# Patient Record
Sex: Male | Born: 1950 | Race: White | Hispanic: No | Marital: Married | Smoking: Former smoker
Health system: Southern US, Community
[De-identification: ages and names within clinical notes are randomized; demographics above are authoritative.]

## PROBLEM LIST (undated history)

## (undated) DIAGNOSIS — M1990 Unspecified osteoarthritis, unspecified site: Secondary | ICD-10-CM

## (undated) HISTORY — PX: WISDOM TOOTH EXTRACTION: SHX21

## (undated) HISTORY — PX: NEUROMA SURGERY: SHX722

## (undated) HISTORY — PX: CATARACT EXTRACTION: SUR2

---

## 2014-01-26 DIAGNOSIS — N400 Enlarged prostate without lower urinary tract symptoms: Secondary | ICD-10-CM | POA: Diagnosis present

## 2015-05-30 DIAGNOSIS — N529 Male erectile dysfunction, unspecified: Secondary | ICD-10-CM | POA: Insufficient documentation

## 2018-07-29 DIAGNOSIS — M5416 Radiculopathy, lumbar region: Secondary | ICD-10-CM | POA: Insufficient documentation

## 2019-09-14 ENCOUNTER — Ambulatory Visit (INDEPENDENT_AMBULATORY_CARE_PROVIDER_SITE_OTHER): Payer: Medicare HMO

## 2019-09-14 ENCOUNTER — Telehealth: Payer: Self-pay

## 2019-09-14 ENCOUNTER — Other Ambulatory Visit: Payer: Self-pay

## 2019-09-14 ENCOUNTER — Ambulatory Visit: Payer: Medicare HMO | Admitting: Orthopaedic Surgery

## 2019-09-14 DIAGNOSIS — G8929 Other chronic pain: Secondary | ICD-10-CM

## 2019-09-14 DIAGNOSIS — M1712 Unilateral primary osteoarthritis, left knee: Secondary | ICD-10-CM | POA: Diagnosis not present

## 2019-09-14 DIAGNOSIS — M25562 Pain in left knee: Secondary | ICD-10-CM

## 2019-09-14 NOTE — Telephone Encounter (Signed)
Left knee gel injection ?

## 2019-09-14 NOTE — Telephone Encounter (Signed)
Submitted for VOB for Monovisc-left knee  

## 2019-09-14 NOTE — Progress Notes (Signed)
Office Visit Note   Patient: Brandon Blackwell           Date of Birth: [birth year]           MRN: [id number] Visit Date: 09/14/2019              Requested by: No referring provider defined for this encounter. PCP: Patient, No Pcp Per   Assessment & Plan: Visit Diagnoses:  1. Chronic pain of left knee   2. Unilateral primary osteoarthritis, left knee     Plan: The patient understands that he does have significant osteoarthritis of his left knee.  Given the fact that he has failed a steroid injection in his left knee I do feel he is a candidate to try hyaluronic acid.  I gave him a handout about this.  We talked about the risk and benefits of those types of injections and a treatment plan for if that fails which would be a knee replacement.  All questions and concerns were answered and addressed.  We will see him back in about 3 weeks to place hopefully hyaluronic acid into the left knee to treat the pain from osteoarthritis.  Follow-Up Instructions: Return in about 3 weeks (around 10/05/2019).   Orders:  Orders Placed This Encounter  Procedures  . XR Knee 1-2 Views Left   No orders of the defined types were placed in this encounter.     Procedures: No procedures performed   Clinical Data: No additional findings.   Subjective: Chief Complaint  Patient presents with  . Left Knee - Pain  The patient is someone I am seeing for the first time as a new patient.  He comes in as referral from other patients of mine.  He comes in with a chief complaint of left knee pain is been getting slowly worse for a long period of time.  He has had a steroid injection in his left knee that did not help much at all.  He points to mainly the medial joint line as a source of his pain.  It hurts mainly with activities.  He is a very active 69 year old gentleman.  He denies any locking and catching.  It is more of an aching type of pain.  He is otherwise a very active individual with no significant  medical issues.  HPI  Review of Systems He currently denies any headache, chest pain, shortness of breath, fever, chills, nausea, vomiting  Objective: Vital Signs: There were no vitals taken for this visit.  Physical Exam He is alert and orient x3 and in no acute distress Ortho Exam Examination of his left knee does show varus malalignment that is difficult to correct.  He has full range of motion of his knee with significant patellofemoral crepitation as well as medial joint line tenderness.  His knee feels ligamentously stable. Specialty Comments:  No specialty comments available.  Imaging: XR Knee 1-2 Views Left  Result Date: 09/14/2019 2 views of the left knee show tricompartmental arthritic changes.  There is varus malalignment.  There is osteophytes in all 3 compartments.  There is significant medial joint space narrowing.  There is also sclerotic changes.    PMFS History: Patient Active Problem List   Diagnosis Date Noted  . Unilateral primary osteoarthritis, left knee 09/14/2019   No past medical history on file.  No family history on file.   Social History   Occupational History  . Not on file  Tobacco Use  . Smoking status: Not on  file  Substance and Sexual Activity  . Alcohol use: Not on file  . Drug use: Not on file  . Sexual activity: Not on file

## 2019-09-15 ENCOUNTER — Telehealth: Payer: Self-pay

## 2019-09-15 NOTE — Telephone Encounter (Signed)
Approved for Monovisc-left knee  Dr. Waynette Buttery 10/05/19 New start Buy and Bill $25 copay 20% OOP Prior auth required with [employer] auth # [id number] Dates : 09/15/19-03/14/20

## 2019-09-27 ENCOUNTER — Ambulatory Visit: Payer: Medicare HMO | Attending: Internal Medicine

## 2019-09-27 DIAGNOSIS — Z23 Encounter for immunization: Secondary | ICD-10-CM | POA: Insufficient documentation

## 2019-09-27 NOTE — Progress Notes (Signed)
   Covid-19 Vaccination Clinic  Name:  Brandon Blackwell    MRN: [id number] DOB: 02-18-1951  09/27/2019  [name] was observed post Covid-19 immunization for 15 minutes without incidence. He was provided with Vaccine Information Sheet and instruction to access the V-Safe system.   [name] was instructed to call 911 with any severe reactions post vaccine: Marland Kitchen Difficulty breathing  . Swelling of your face and throat  . A fast heartbeat  . A bad rash all over your body  . Dizziness and weakness    Immunizations Administered    Name Date Dose VIS Date Route   Pfizer COVID-19 Vaccine 09/27/2019  8:52 AM 0.3 mL 07/10/2019 Intramuscular   Manufacturer: [employer], [employer]   Lot: [id number]   NDC: [address]-7

## 2019-10-05 ENCOUNTER — Ambulatory Visit: Payer: Medicare HMO | Admitting: Orthopaedic Surgery

## 2019-10-05 ENCOUNTER — Encounter: Payer: Self-pay | Admitting: Orthopaedic Surgery

## 2019-10-05 ENCOUNTER — Other Ambulatory Visit: Payer: Self-pay

## 2019-10-05 DIAGNOSIS — M1712 Unilateral primary osteoarthritis, left knee: Secondary | ICD-10-CM

## 2019-10-05 MED ORDER — HYALURONAN 88 MG/4ML IX SOSY
88.0000 mg | PREFILLED_SYRINGE | INTRA_ARTICULAR | Status: AC | PRN
  Administered 2019-10-05: 88 mg via INTRA_ARTICULAR

## 2019-10-05 NOTE — Progress Notes (Signed)
   Procedure Note  Patient: Brandon Blackwell             Date of Birth: [birth year]           MRN: [id number]             Visit Date: 10/05/2019  Procedures: Visit Diagnoses:  1. Unilateral primary osteoarthritis, left knee     Large Joint Inj: L knee on 10/05/2019 9:26 AM Indications: diagnostic evaluation and pain Details: 22 G 1.5 in needle, superolateral approach  Arthrogram: No  Medications: 88 mg Hyaluronan 88 MG/4ML Outcome: tolerated well, no immediate complications Procedure, treatment alternatives, risks and benefits explained, specific risks discussed. Consent was given by the patient. Immediately prior to procedure a time out was called to verify the correct patient, procedure, equipment, support staff and site/side marked as required. Patient was prepped and draped in the usual sterile fashion.    The patient comes in today for scheduled hyaluronic acid injection with Monovisc in the left knee to treat the pain from osteoarthritis.  He has tried and failed other forms of conservative treatment including steroid injection.  His pain is daily.  It hurts mainly with mobility.  There is some stiffness in his knee as well.  On exam there is no effusion today and his left knee moves smoothly.  It is ligamentously stable but has some global tenderness consistent with osteoarthritis.  At this point I did place Monovisc in the left knee without difficulty.  All question concerns were answered addressed.  Follow-up will be as needed.  If conservative treatment continues to fail our other recommendation would be a knee replacement.

## 2019-10-27 ENCOUNTER — Ambulatory Visit: Payer: Medicare HMO | Attending: Internal Medicine

## 2019-10-27 DIAGNOSIS — Z23 Encounter for immunization: Secondary | ICD-10-CM

## 2019-10-27 NOTE — Progress Notes (Signed)
   Covid-19 Vaccination Clinic  Name:  Brandon Blackwell    MRN: [id number] DOB: 08/02/1950  10/27/2019  Brandon Blackwell was observed post Covid-19 immunization for 15 minutes without incident. He was provided with Vaccine Information Sheet and instruction to access the V-Safe system.   Brandon Blackwell was instructed to call 911 with any severe reactions post vaccine: Marland Kitchen Difficulty breathing  . Swelling of face and throat  . A fast heartbeat  . A bad rash all over body  . Dizziness and weakness   Immunizations Administered    Name Date Dose VIS Date Route   Pfizer COVID-19 Vaccine 10/27/2019  8:19 AM 0.3 mL 07/10/2019 Intramuscular   Manufacturer: [employer], [employer]   Lot: [id number]   NDC: [address]-9

## 2020-04-26 ENCOUNTER — Telehealth: Payer: Self-pay

## 2020-04-26 NOTE — Telephone Encounter (Signed)
Ye.  That will be fine to go ahead and order. Thanks

## 2020-04-26 NOTE — Telephone Encounter (Signed)
Is it okay for patient to receive another gel injection for left knee?  Please advise.  Thank you.

## 2020-04-26 NOTE — Telephone Encounter (Signed)
Ok to get [place] and schedule? Last gel injection was March 2021

## 2020-04-27 NOTE — Telephone Encounter (Signed)
Noted  

## 2020-04-28 ENCOUNTER — Telehealth: Payer: Self-pay

## 2020-04-28 NOTE — Telephone Encounter (Signed)
Submitted VOB, Monovisc, left knee. 

## 2020-04-29 ENCOUNTER — Telehealth: Payer: Self-pay

## 2020-04-29 NOTE — Telephone Encounter (Signed)
PA required for Monovisc, left knee. PA currently Pending through [employer] online Pending PA# [phone]

## 2020-05-05 ENCOUNTER — Telehealth: Payer: Self-pay

## 2020-05-05 NOTE — Telephone Encounter (Signed)
Called and left a VM for pateint to call back and schedule an appointment with Dr. Magnus Ivan or Bronson Curb for gel injection.  Approved, Monovisc, left knee. Buy & Bill Patient will be responsible for 20% OOP. Co-pay of $25.00 PA required PA Approval# [id number] Valid 04/29/2020- 10/28/2020

## 2020-05-11 ENCOUNTER — Ambulatory Visit: Payer: Medicare HMO | Admitting: Physician Assistant

## 2020-05-11 ENCOUNTER — Encounter: Payer: Self-pay | Admitting: Physician Assistant

## 2020-05-11 DIAGNOSIS — M1712 Unilateral primary osteoarthritis, left knee: Secondary | ICD-10-CM

## 2020-05-11 MED ORDER — LIDOCAINE HCL 1 % IJ SOLN
0.5000 mL | INTRAMUSCULAR | Status: AC | PRN
  Administered 2020-05-11: .5 mL

## 2020-05-11 MED ORDER — HYALURONAN 88 MG/4ML IX SOSY
88.0000 mg | PREFILLED_SYRINGE | INTRA_ARTICULAR | Status: AC | PRN
  Administered 2020-05-11: 88 mg via INTRA_ARTICULAR

## 2020-05-11 NOTE — Progress Notes (Signed)
   Procedure Note  Patient: Brandon Blackwell             Date of Birth: 03-Apr-1951           MRN: [id number]             Visit Date: 05/11/2020 HPI: Patient returns today for Monovisc injection left knee.  States he has some deep joint pain worse with walking.  Sometimes has sharp shooting pain like the knee may give way.  Takes Advil as needed.    Physical exam: Left knee good range of motion.  Positive effusion no abnormal warmth erythema.  Procedures: Visit Diagnoses:  1. Unilateral primary osteoarthritis, left knee     Large Joint Inj on 05/11/2020 5:31 PM Indications: pain Details: 22 G 1.5 in needle, superolateral approach  Arthrogram: No  Medications: 88 mg Hyaluronan 88 MG/4ML; 0.5 mL lidocaine 1 % Aspirate: 22 mL yellow Outcome: tolerated well, no immediate complications Procedure, treatment alternatives, risks and benefits explained, specific risks discussed. Consent was given by the patient. Immediately prior to procedure a time out was called to verify the correct patient, procedure, equipment, support staff and site/side marked as required. Patient was prepped and draped in the usual sterile fashion.    Plan: He will follow-up on as-needed basis.  He knows to wait at least 6 months between injections with Monovisc 3 months for cortisone.  Questions were encouraged and answered.

## 2020-05-11 NOTE — Addendum Note (Signed)
Addended by: Richardean Canal on: 05/11/2020 05:33 PM   Modules accepted: Orders

## 2020-06-19 ENCOUNTER — Other Ambulatory Visit: Payer: Self-pay

## 2020-06-19 ENCOUNTER — Encounter (HOSPITAL_BASED_OUTPATIENT_CLINIC_OR_DEPARTMENT_OTHER): Payer: Self-pay | Admitting: Emergency Medicine

## 2020-06-19 ENCOUNTER — Emergency Department (HOSPITAL_BASED_OUTPATIENT_CLINIC_OR_DEPARTMENT_OTHER)
Admission: EM | Admit: 2020-06-19 | Discharge: 2020-06-19 | Disposition: A | Discharge status: Home / Self Care | Payer: Medicare HMO | Attending: Emergency Medicine | Admitting: Emergency Medicine

## 2020-06-19 ENCOUNTER — Emergency Department (HOSPITAL_BASED_OUTPATIENT_CLINIC_OR_DEPARTMENT_OTHER): Payer: Medicare HMO

## 2020-06-19 DIAGNOSIS — R197 Diarrhea, unspecified: Secondary | ICD-10-CM | POA: Diagnosis not present

## 2020-06-19 DIAGNOSIS — J069 Acute upper respiratory infection, unspecified: Secondary | ICD-10-CM | POA: Diagnosis not present

## 2020-06-19 DIAGNOSIS — R1110 Vomiting, unspecified: Secondary | ICD-10-CM | POA: Insufficient documentation

## 2020-06-19 DIAGNOSIS — R059 Cough, unspecified: Secondary | ICD-10-CM | POA: Diagnosis present

## 2020-06-19 DIAGNOSIS — Z20822 Contact with and (suspected) exposure to covid-19: Secondary | ICD-10-CM | POA: Insufficient documentation

## 2020-06-19 DIAGNOSIS — J40 Bronchitis, not specified as acute or chronic: Secondary | ICD-10-CM | POA: Diagnosis not present

## 2020-06-19 LAB — RESP PANEL BY RT-PCR (FLU A&B, COVID) ARPGX2
Influenza A by PCR: NEGATIVE
Influenza B by PCR: NEGATIVE
SARS Coronavirus 2 by RT PCR: NEGATIVE

## 2020-06-19 MED ORDER — ONDANSETRON 8 MG PO TBDP: 8.0000 mg | ORAL_TABLET | Freq: Three times a day (TID) | ORAL | 0 refills | Status: DC | PRN

## 2020-06-19 MED ORDER — HYDROCOD POLST-CPM POLST ER 10-8 MG/5ML PO SUER: 5.0000 mL | Freq: Two times a day (BID) | ORAL | 0 refills | Status: DC | PRN

## 2020-06-19 MED ORDER — ALBUTEROL SULFATE HFA 108 (90 BASE) MCG/ACT IN AERS
2.0000 | INHALATION_SPRAY | RESPIRATORY_TRACT | Status: DC | PRN
  Administered 2020-06-19: 2 via RESPIRATORY_TRACT
  Filled 2020-06-19: qty 6.7

## 2020-06-19 MED ORDER — ONDANSETRON 4 MG PO TBDP
8.0000 mg | ORAL_TABLET | Freq: Once | ORAL | Status: AC
  Administered 2020-06-19: 8 mg via ORAL
  Filled 2020-06-19: qty 2

## 2020-06-19 NOTE — ED Notes (Signed)
Patient transported to X-ray 

## 2020-06-19 NOTE — ED Triage Notes (Signed)
Cough, nausea since Tuesday, states he "had a cold since Sunday, but that got better". Pt believes his cough to be worse "in different positions", also endorses general abd pain and diarrhea. Denies fever, reports decreased intake x 4 days.  States he has been fully vaccinated for Covid (second shot 10/27/19) and took an at-home Covid test which was negative.

## 2020-06-19 NOTE — ED Provider Notes (Addendum)
[facility] Provider Note: Lowella Dell, MD, FACEP  CSN: [id number] MRN: [id number] ARRIVAL: 06/19/20 at 0311 ROOM: [facility]01/[facility]   CHIEF COMPLAINT  Cough   HISTORY OF PRESENT ILLNESS  06/19/20 3:31 AM Brandon Blackwell is a 69 y.o. male with URI symptoms for about a week.  Specifically he has had a cough which is occasionally productive.  It has become severe enough to keep him awake at night.  He has not gotten adequate relief with over-the-counter medications.  He has had diarrhea and a "queasy" feeling in his stomach including one episode of vomiting.  He has also had acid reflux for the past 2 days for which she started Nexium.  He has not had a fever, body aches, sore throat, shortness of breath, or loss of taste or smell.  He took a home Covid test 5 days ago which was negative.  He has been vaccinated for both Covid and influenza.    History reviewed. No pertinent past medical history.  Past Surgical History:  Procedure Laterality Date  . CATARACT EXTRACTION      No family history on file.  Social History   Tobacco Use  . Smoking status: Never Smoker  . Smokeless tobacco: Never Used  Substance Use Topics  . Alcohol use: Never  . Drug use: Never    Prior to Admission medications   Medication Sig Start Date End Date Taking? Authorizing Provider  chlorpheniramine-HYDROcodone (TUSSIONEX PENNKINETIC ER) 10-8 MG/5ML SUER Take 5 mLs by mouth every 12 (twelve) hours as needed for cough. 06/19/20   Marquis Down, MD  ondansetron (ZOFRAN ODT) 8 MG disintegrating tablet Take 1 tablet (8 mg total) by mouth every 8 (eight) hours as needed for nausea or vomiting. 06/19/20   Jazir Newey, MD  sildenafil (REVATIO) 20 MG tablet TAKE 2-5 TABLETS 1 HOUR PRIOR AS NEEDED 01/15/19   [provider]  tadalafil (CIALIS) 5 MG tablet TAKE ONE TABLET BY MOUTH DAILY 08/24/19   [provider]    Allergies Patient has no known allergies.   REVIEW OF SYSTEMS    Negative except as noted here or in the History of Present Illness.   PHYSICAL EXAMINATION  Initial Vital Signs Blood pressure (!) 160/101, pulse 87, temperature 98.2 F (36.8 C), resp. rate 20, height 6\' 1"  (1.854 m), weight 102.1 kg, SpO2 95 %.  Examination General: Well-developed, well-nourished male in no acute distress; appearance consistent with age of record HENT: normocephalic; atraumatic Eyes: pupils equal, round and reactive to light; extraocular muscles intact Neck: supple Heart: regular rate and rhythm Lungs: clear to auscultation bilaterally Abdomen: soft; nondistended; nontender; bowel sounds present Extremities: No deformity; full range of motion; pulses normal Neurologic: Awake, alert and oriented; motor function intact in all extremities and symmetric; no facial droop Skin: Warm and dry Psychiatric: Normal mood and affect   RESULTS  Summary of this visit's results, reviewed and interpreted by myself:   EKG Interpretation  Date/Time:    Ventricular Rate:    PR Interval:    QRS Duration:   QT Interval:    QTC Calculation:   R Axis:     Text Interpretation:        Laboratory Studies: Results for orders placed or performed during the hospital encounter of 06/19/20 (from the past 24 hour(s))  Resp Panel by RT-PCR (Flu A&B, Covid) Nasopharyngeal Swab     Status: None   Collection Time: 06/19/20  3:32 AM   Specimen: Nasopharyngeal Swab; Nasopharyngeal(NP) swabs in vial  transport medium  Result Value Ref Range   SARS Coronavirus 2 by RT PCR NEGATIVE NEGATIVE   Influenza A by PCR NEGATIVE NEGATIVE   Influenza B by PCR NEGATIVE NEGATIVE   Imaging Studies: DG Chest 2 View  Result Date: 06/19/2020 CLINICAL DATA:  Initial evaluation for acute cough. EXAM: CHEST - 2 VIEW COMPARISON:  None. FINDINGS: Transverse heart size at the upper limits of normal. Mediastinal silhouette within normal limits. Lungs hypoinflated. Linear bibasilar opacities most consistent  with subsegmental atelectasis. No focal infiltrates. No edema or effusion. No pneumothorax. No acute osseous finding. Remotely healed left-sided rib fractures noted. IMPRESSION: 1. Shallow lung inflation with mild bibasilar subsegmental atelectasis. 2. No other active cardiopulmonary disease. Electronically Signed   By: Rise Mu M.D.   On: 06/19/2020 04:43    ED COURSE and MDM  Nursing notes, initial and subsequent vitals signs, including pulse oximetry, reviewed and interpreted by myself.  Vitals:   06/19/20 0323 06/19/20 0450  BP: (!) 160/101 (!) 145/83  Pulse: 87 81  Resp: 20 18  Temp: 98.2 F (36.8 C)   SpO2: 95% 96%  Weight: 102.1 kg   Height: 6\' 1"  (1.854 m)    Medications  albuterol (VENTOLIN HFA) 108 (90 Base) MCG/ACT inhaler 2 puff (2 puffs Inhalation Given 06/19/20 0453)  ondansetron (ZOFRAN-ODT) disintegrating tablet 8 mg (8 mg Oral Given 06/19/20 0342)   4:44 AM The patient is insisting on being discharged despite the Covid test not having resulted.  His presentation is consistent with a viral respiratory illness.  He was advised he could be COVID-19, influenza or something else.  He may benefit from an albuterol inhaler given his shallow lung inflation.   PROCEDURES  Procedures   ED DIAGNOSES     ICD-10-CM   1. Viral URI with cough  J06.9        Zanayah Shadowens, MD 06/19/20 0447    06/21/20, MD 06/19/20 [phone]

## 2020-06-21 DIAGNOSIS — N183 Chronic kidney disease, stage 3 unspecified: Secondary | ICD-10-CM | POA: Diagnosis present

## 2020-06-21 DIAGNOSIS — D751 Secondary polycythemia: Secondary | ICD-10-CM | POA: Insufficient documentation

## 2020-06-21 DIAGNOSIS — R0989 Other specified symptoms and signs involving the circulatory and respiratory systems: Secondary | ICD-10-CM | POA: Insufficient documentation

## 2020-06-21 DIAGNOSIS — K219 Gastro-esophageal reflux disease without esophagitis: Secondary | ICD-10-CM | POA: Diagnosis present

## 2021-01-23 ENCOUNTER — Telehealth: Payer: Self-pay | Admitting: Orthopaedic Surgery

## 2021-01-23 NOTE — Telephone Encounter (Signed)
Patient called needing to get the gel injection. The number to contact patient is  [phone]

## 2021-01-23 NOTE — Telephone Encounter (Signed)
Noted  

## 2021-01-23 NOTE — Telephone Encounter (Signed)
Last done in October. Ok for repeat. Please get authorization. Thank you

## 2021-02-10 ENCOUNTER — Telehealth: Payer: Self-pay

## 2021-02-10 NOTE — Telephone Encounter (Signed)
VOB submitted for Monovisc, left knee. Pending BV. 

## 2021-03-13 ENCOUNTER — Ambulatory Visit: Payer: Medicare HMO | Admitting: Orthopaedic Surgery

## 2021-03-13 ENCOUNTER — Telehealth: Payer: Self-pay

## 2021-03-13 ENCOUNTER — Other Ambulatory Visit: Payer: Self-pay

## 2021-03-13 VITALS — Ht 73.0 in | Wt 225.0 lb

## 2021-03-13 DIAGNOSIS — M1712 Unilateral primary osteoarthritis, left knee: Secondary | ICD-10-CM

## 2021-03-13 DIAGNOSIS — M25562 Pain in left knee: Secondary | ICD-10-CM

## 2021-03-13 DIAGNOSIS — G8929 Other chronic pain: Secondary | ICD-10-CM

## 2021-03-13 MED ORDER — HYALURONAN 88 MG/4ML IX SOSY
88.0000 mg | PREFILLED_SYRINGE | INTRA_ARTICULAR | Status: AC | PRN
  Administered 2021-03-13: 88 mg via INTRA_ARTICULAR

## 2021-03-13 MED ORDER — LIDOCAINE HCL 1 % IJ SOLN
3.0000 mL | INTRAMUSCULAR | Status: AC | PRN
  Administered 2021-03-13: 3 mL

## 2021-03-13 NOTE — Telephone Encounter (Signed)
Approved for Monovisc-left knee Buy and bill Dr. Magnus Ivan 20% copay $20 copay [employer] auth # [id number] Dates: 02/22/21-05/23/21

## 2021-03-13 NOTE — Progress Notes (Signed)
   Procedure Note  Patient: Brandon Blackwell             Date of Birth: 09-06-1950           MRN: [id number]             Visit Date: 03/13/2021  Procedures: Visit Diagnoses:  1. Unilateral primary osteoarthritis, left knee   2. Chronic pain of left knee     Large Joint Inj: L knee on 03/13/2021 9:41 AM Indications: diagnostic evaluation and pain Details: 22 G 1.5 in needle, superolateral approach  Arthrogram: No  Medications: 3 mL lidocaine 1 %; 88 mg Hyaluronan 88 MG/4ML Outcome: tolerated well, no immediate complications Procedure, treatment alternatives, risks and benefits explained, specific risks discussed. Consent was given by the patient. Immediately prior to procedure a time out was called to verify the correct patient, procedure, equipment, support staff and site/side marked as required. Patient was prepped and draped in the usual sterile fashion.   The patient comes in today for scheduled hyaluronic acid injection with Monovisc in his left knee to treat the pain from osteoarthritis.  He has had these injections in the past and this is really helped him well in terms of dealing with the osteoarthritis pain in his left knee.  Steroid injections and other conservative treatments have not helped as much as the hyaluronic acid has.  It has been 6 months since his last injection and that is lasted until now and this is the best treatment regimen for him other than knee replacement surgery.  He has had no acute change in medical status.  I was able to aspirate 30 cc of clear yellow fluid from the knee consistent with osteoarthritis.  I then placed a Monovisc injection in his left knee which he tolerated well.  He knows to wait at least a minimum of 6 months between injections.  All questions and concerns were answered and addressed.

## 2021-11-29 ENCOUNTER — Telehealth: Payer: Self-pay | Admitting: Orthopaedic Surgery

## 2021-11-29 NOTE — Telephone Encounter (Signed)
VOB submitted for Monovisc, left knee. °BV pending. °

## 2021-11-29 NOTE — Telephone Encounter (Signed)
Approval for next gel injection. Please submit per patient and call once approved. ?

## 2021-12-14 ENCOUNTER — Other Ambulatory Visit: Payer: Self-pay

## 2021-12-14 ENCOUNTER — Telehealth: Payer: Self-pay

## 2021-12-14 DIAGNOSIS — M1712 Unilateral primary osteoarthritis, left knee: Secondary | ICD-10-CM

## 2021-12-14 NOTE — Telephone Encounter (Signed)
Called and left a VM for patient to call back  and schedule for gel injection with Dr. Magnus Ivan or Richardean Canal  Check referrals tab

## 2022-01-01 ENCOUNTER — Encounter: Payer: Self-pay | Admitting: Orthopaedic Surgery

## 2022-01-01 ENCOUNTER — Ambulatory Visit: Payer: Medicare HMO | Admitting: Orthopaedic Surgery

## 2022-01-01 DIAGNOSIS — M1712 Unilateral primary osteoarthritis, left knee: Secondary | ICD-10-CM

## 2022-01-01 MED ORDER — HYALURONAN 88 MG/4ML IX SOSY
88.0000 mg | PREFILLED_SYRINGE | INTRA_ARTICULAR | Status: AC | PRN
  Administered 2022-01-01: 88 mg via INTRA_ARTICULAR

## 2022-01-01 NOTE — Progress Notes (Signed)
   Procedure Note  Patient: Martrell Eguia             Date of Birth: January 04, 1951           MRN: [id number]             Visit Date: 01/01/2022  Procedures: Visit Diagnoses:  1. Unilateral primary osteoarthritis, left knee     Large Joint Inj: L knee on 01/01/2022 9:32 AM Indications: diagnostic evaluation and pain Details: 22 G 1.5 in needle, superolateral approach  Arthrogram: No  Medications: 88 mg Hyaluronan 88 MG/4ML Outcome: tolerated well, no immediate complications Procedure, treatment alternatives, risks and benefits explained, specific risks discussed. Consent was given by the patient. Immediately prior to procedure a time out was called to verify the correct patient, procedure, equipment, support staff and site/side marked as required. Patient was prepped and draped in the usual sterile fashion.   The patient is a 71 year old gentleman well-known to me.  He has significant arthritis in his left knee.  He has had hyaluronic acid in the past and that is helped him in terms of pain control for his knee and the last injection was over 6 months ago.  He has had no acute change in medical status.  On exam today there is no significant effusion of his left knee but there is varus malalignment and patellofemoral crepitation but excellent range of motion.  I did place Monovisc in his left knee today without difficulty.  All questions and concerns were answered and addressed.  Follow-up is as needed.  He knows to wait at least 6 months between hyaluronic acid injections.

## 2022-06-18 ENCOUNTER — Ambulatory Visit (INDEPENDENT_AMBULATORY_CARE_PROVIDER_SITE_OTHER): Payer: Medicare HMO

## 2022-06-18 ENCOUNTER — Encounter: Payer: Self-pay | Admitting: Orthopaedic Surgery

## 2022-06-18 ENCOUNTER — Ambulatory Visit: Payer: Medicare HMO | Admitting: Orthopaedic Surgery

## 2022-06-18 VITALS — Ht 71.0 in | Wt 214.6 lb

## 2022-06-18 DIAGNOSIS — M1712 Unilateral primary osteoarthritis, left knee: Secondary | ICD-10-CM | POA: Diagnosis not present

## 2022-06-18 NOTE — Progress Notes (Signed)
The patient has a long history as it relates to the osteoarthritis of his left knee.  He is 71 years old and very active.  He has been dealing with left knee pain for many years now and has tried multiple forms of conservative treatment including even steroid injections and hyaluronic acid injections for his left knee to treat the pain from osteoarthritis.  This has been well-documented with clinical exam and x-rays.  The last x-rays were from 2 and half years ago of his left knee showing varus malalignment and bone-on-bone wear the medial compartment and patellofemoral joint.  At this point the patient is interested in knee replacement surgery.  His left knee pain has become daily and it is detrimentally affecting his mobility, his quality of life and his actives daily living.  He is a thin individual.  He is not a diabetic.  He works out regularly as well.  His wife is with him today.  We have tried again steroid injections and hyaluronic acid injections.  He is worked on a regular exercise routine with strengthening his quads.  On exam he has excellent range of motion of his  knee but varus malalignment that is barely correctable.  He has significant medial joint line tenderness and severe patellofemoral crepitation the patella tracks well and there is good range of motion.  X-rays of the left knee show worsening and severe tricompartment arthritis.  There is varus malalignment with significant loss there is almost complete loss of the medial compartment and patellofemoral joint.  There are osteophytes in all 3 compartments.  At this point the patient is interested in knee replacement surgery.  I showed him a knee replacement model and explained in length in detail what the surgery involves.  We discussed the risk and benefits of surgery.  We discussed what to expect from an intraoperative and postoperative course.  All questions and concerns were answered and addressed.  We will be in touch with scheduling  surgery.

## 2022-07-02 ENCOUNTER — Other Ambulatory Visit: Payer: Self-pay

## 2022-07-04 ENCOUNTER — Other Ambulatory Visit: Payer: Self-pay | Admitting: Physician Assistant

## 2022-07-04 DIAGNOSIS — Z01818 Encounter for other preprocedural examination: Secondary | ICD-10-CM

## 2022-07-04 NOTE — Progress Notes (Signed)
Sent message, via epic in basket, requesting orders in epic from surgeon.  

## 2022-07-04 NOTE — Patient Instructions (Addendum)
SURGICAL WAITING ROOM VISITATION Patients having surgery or a procedure may have no more than 2 support people in the waiting area - these visitors may rotate.   Children under the age of 22 must have an adult with them who is not the patient. If the patient needs to stay at the hospital during part of their recovery, the visitor guidelines for inpatient rooms apply. Pre-op nurse will coordinate an appropriate time for 1 support person to accompany patient in pre-op.  This support person may not rotate.    Please refer to the [facility] website for the visitor guidelines for Inpatients (after your surgery is over and you are in a regular room).      Your procedure is scheduled on: 07-13-22   Report to [facility] Main Entrance    Report to admitting at 11:00 AM   Call this number if you have problems the morning of surgery [phone]   Do not eat food :After Midnight.   After Midnight you may have the following liquids until 10:30 AM DAY OF SURGERY  Water Non-Citrus Juices (without pulp, NO RED) Carbonated Beverages Black Coffee (NO MILK/CREAM OR CREAMERS, sugar ok)  Clear Tea (NO MILK/CREAM OR CREAMERS, sugar ok) regular and decaf                             Plain Jell-O (NO RED)                                           Fruit ices (not with fruit pulp, NO RED)                                     Popsicles (NO RED)                                                               Sports drinks like Gatorade (NO RED)                   The day of surgery:  Drink ONE (1) Pre-Surgery Clear Ensure at 10:30 AM the morning of surgery. Drink in one sitting. Do not sip.  This drink was given to you during your hospital  pre-op appointment visit. Nothing else to drink after completing the Pre-Surgery Clear Ensure.          If you have questions, please contact your surgeon's office.   FOLLOW  ANY ADDITIONAL PRE OP INSTRUCTIONS YOU RECEIVED FROM YOUR SURGEON'S OFFICE!!!      Oral Hygiene is also important to reduce your risk of infection.                                    Remember - BRUSH YOUR TEETH THE MORNING OF SURGERY WITH YOUR REGULAR TOOTHPASTE   Do NOT smoke after Midnight   Take these medicines the morning of surgery with A SIP OF WATER:   Tylenol if needed  You may not have any metal on your body including jewelry, and body piercing             Do not wear lotions, powders, cologne, or deodorant              Men may shave face and neck.   Do not bring valuables to the hospital. [facility] IS NOT RESPONSIBLE   FOR VALUABLES.   Contacts, dentures or bridgework may not be worn into surgery.   Bring small overnight bag day of surgery.   DO NOT BRING YOUR HOME MEDICATIONS TO THE HOSPITAL. PHARMACY WILL DISPENSE MEDICATIONS LISTED ON YOUR MEDICATION LIST TO YOU DURING YOUR ADMISSION IN THE HOSPITAL!    Special Instructions: Bring a copy of your healthcare power of attorney and living will documents the day of surgery if you haven't scanned them before.              Please read over the following fact sheets you were given: IF YOU HAVE QUESTIONS ABOUT YOUR PRE-OP INSTRUCTIONS PLEASE CALL [phone] Gwen  If you received a COVID test during your pre-op visit  it is requested that you wear a mask when out in public, stay away from anyone that may not be feeling well and notify your surgeon if you develop symptoms. If you test positive for Covid or have been in contact with anyone that has tested positive in the last 10 days please notify you surgeon.  [place] - Preparing for Surgery Before surgery, you can play an important role.  Because skin is not sterile, your skin needs to be as free of germs as possible.  You can reduce the number of germs on your skin by washing with CHG (chlorahexidine gluconate) soap before surgery.  CHG is an antiseptic cleaner which kills germs and bonds with the skin to continue killing  germs even after washing. Please DO NOT use if you have an allergy to CHG or antibacterial soaps.  If your skin becomes reddened/irritated stop using the CHG and inform your nurse when you arrive at Short Stay. Do not shave (including legs and underarms) for at least 48 hours prior to the first CHG shower.  You may shave your face/neck.  Please follow these instructions carefully:  1.  Shower with CHG Soap the night before surgery and the  morning of surgery.  2.  If you choose to wash your hair, wash your hair first as usual with your normal  shampoo.  3.  After you shampoo, rinse your hair and body thoroughly to remove the shampoo.                             4.  Use CHG as you would any other liquid soap.  You can apply chg directly to the skin and wash.  Gently with a scrungie or clean washcloth.  5.  Apply the CHG Soap to your body ONLY FROM THE NECK DOWN.   Do   not use on face/ open                           Wound or open sores. Avoid contact with eyes, ears mouth and   genitals (private parts).                       Wash face,  Genitals (private parts) with your normal soap.  6.  Wash thoroughly, paying special attention to the area where your    surgery  will be performed.  7.  Thoroughly rinse your body with warm water from the neck down.  8.  DO NOT shower/wash with your normal soap after using and rinsing off the CHG Soap.                9.  Pat yourself dry with a clean towel.            10.  Wear clean pajamas.            11.  Place clean sheets on your bed the night of your first shower and do not  sleep with pets. Day of Surgery : Do not apply any lotions/deodorants the morning of surgery.  Please wear clean clothes to the hospital/surgery center.  FAILURE TO FOLLOW THESE INSTRUCTIONS MAY RESULT IN THE CANCELLATION OF YOUR SURGERY  PATIENT SIGNATURE_________________________________  NURSE  SIGNATURE__________________________________  ________________________________________________________________________    Adam Phenix  An incentive spirometer is a tool that can help keep your lungs clear and active. This tool measures how well you are filling your lungs with each breath. Taking long deep breaths may help reverse or decrease the chance of developing breathing (pulmonary) problems (especially infection) following: A long period of time when you are unable to move or be active. BEFORE THE PROCEDURE  If the spirometer includes an indicator to show your best effort, your nurse or respiratory therapist will set it to a desired goal. If possible, sit up straight or lean slightly forward. Try not to slouch. Hold the incentive spirometer in an upright position. INSTRUCTIONS FOR USE  Sit on the edge of your bed if possible, or sit up as far as you can in bed or on a chair. Hold the incentive spirometer in an upright position. Breathe out normally. Place the mouthpiece in your mouth and seal your lips tightly around it. Breathe in slowly and as deeply as possible, raising the piston or the ball toward the top of the column. Hold your breath for 3-5 seconds or for as long as possible. Allow the piston or ball to fall to the bottom of the column. Remove the mouthpiece from your mouth and breathe out normally. Rest for a few seconds and repeat Steps 1 through 7 at least 10 times every 1-2 hours when you are awake. Take your time and take a few normal breaths between deep breaths. The spirometer may include an indicator to show your best effort. Use the indicator as a goal to work toward during each repetition. After each set of 10 deep breaths, practice coughing to be sure your lungs are clear. If you have an incision (the cut made at the time of surgery), support your incision when coughing by placing a pillow or rolled up towels firmly against it. Once you are able to get out of  bed, walk around indoors and cough well. You may stop using the incentive spirometer when instructed by your caregiver.  RISKS AND COMPLICATIONS Take your time so you do not get dizzy or light-headed. If you are in pain, you may need to take or ask for pain medication before doing incentive spirometry. It is harder to take a deep breath if you are having pain. AFTER USE Rest and breathe slowly and easily. It can be helpful to keep track of a log of your progress. Your caregiver can provide you with a simple table to help with  this. If you are using the spirometer at home, follow these instructions: SEEK MEDICAL CARE IF:  You are having difficultly using the spirometer. You have trouble using the spirometer as often as instructed. Your pain medication is not giving enough relief while using the spirometer. You develop fever of 100.5 F (38.1 C) or higher. SEEK IMMEDIATE MEDICAL CARE IF:  You cough up bloody sputum that had not been present before. You develop fever of 102 F (38.9 C) or greater. You develop worsening pain at or near the incision site. MAKE SURE YOU:  Understand these instructions. Will watch your condition. Will get help right away if you are not doing well or get worse. Document Released: 11/26/2006 Document Revised: 10/08/2011 Document Reviewed: 01/27/2007 [employer] Patient Information 2014 [employer], [place].   ________________________________________________________________________  WHAT IS A BLOOD TRANSFUSION? Blood Transfusion Information  A transfusion is the replacement of blood or some of its parts. Blood is made up of multiple cells which provide different functions. Red blood cells carry oxygen and are used for blood loss replacement. White blood cells fight against infection. Platelets control bleeding. Plasma helps clot blood. Other blood products are available for specialized needs, such as hemophilia or other clotting disorders. BEFORE THE TRANSFUSION   Who gives blood for transfusions?  Healthy volunteers who are fully evaluated to make sure their blood is safe. This is blood bank blood. Transfusion therapy is the safest it has ever been in the practice of medicine. Before blood is taken from a donor, a complete history is taken to make sure that person has no history of diseases nor engages in risky social behavior (examples are intravenous drug use or sexual activity with multiple partners). The donor's travel history is screened to minimize risk of transmitting infections, such as malaria. The donated blood is tested for signs of infectious diseases, such as HIV and hepatitis. The blood is then tested to be sure it is compatible with you in order to minimize the chance of a transfusion reaction. If you or a relative donates blood, this is often done in anticipation of surgery and is not appropriate for emergency situations. It takes many days to process the donated blood. RISKS AND COMPLICATIONS Although transfusion therapy is very safe and saves many lives, the main dangers of transfusion include:  Getting an infectious disease. Developing a transfusion reaction. This is an allergic reaction to something in the blood you were given. Every precaution is taken to prevent this. The decision to have a blood transfusion has been considered carefully by your caregiver before blood is given. Blood is not given unless the benefits outweigh the risks. AFTER THE TRANSFUSION Right after receiving a blood transfusion, you will usually feel much better and more energetic. This is especially true if your red blood cells have gotten low (anemic). The transfusion raises the level of the red blood cells which carry oxygen, and this usually causes an energy increase. The nurse administering the transfusion will monitor you carefully for complications. HOME CARE INSTRUCTIONS  No special instructions are needed after a transfusion. You may find your energy is  better. Speak with your caregiver about any limitations on activity for underlying diseases you may have. SEEK MEDICAL CARE IF:  Your condition is not improving after your transfusion. You develop redness or irritation at the intravenous (IV) site. SEEK IMMEDIATE MEDICAL CARE IF:  Any of the following symptoms occur over the next 12 hours: Shaking chills. You have a temperature by mouth above 102 F (38.9  C), not controlled by medicine. Chest, back, or muscle pain. People around you feel you are not acting correctly or are confused. Shortness of breath or difficulty breathing. Dizziness and fainting. You get a rash or develop hives. You have a decrease in urine output. Your urine turns a dark color or changes to pink, red, or brown. Any of the following symptoms occur over the next 10 days: You have a temperature by mouth above 102 F (38.9 C), not controlled by medicine. Shortness of breath. Weakness after normal activity. The white part of the eye turns yellow (jaundice). You have a decrease in the amount of urine or are urinating less often. Your urine turns a dark color or changes to pink, red, or brown. Document Released: 07/13/2000 Document Revised: 10/08/2011 Document Reviewed: 03/01/2008 [facility] Patient Information 2014 [place], [place].  _______________________________________________________________________

## 2022-07-06 ENCOUNTER — Encounter (HOSPITAL_COMMUNITY)
Admission: RE | Admit: 2022-07-06 | Discharge: 2022-07-06 | Disposition: A | Discharge status: Home / Self Care | Payer: Medicare HMO | Source: Ambulatory Visit | Attending: Orthopaedic Surgery | Admitting: Orthopaedic Surgery

## 2022-07-06 ENCOUNTER — Encounter (HOSPITAL_COMMUNITY): Payer: Self-pay

## 2022-07-06 ENCOUNTER — Other Ambulatory Visit: Payer: Self-pay

## 2022-07-06 VITALS — BP 149/70 | HR 57 | Temp 98.0°F | Resp 20 | Ht 72.0 in | Wt 217.8 lb

## 2022-07-06 DIAGNOSIS — Z01818 Encounter for other preprocedural examination: Secondary | ICD-10-CM | POA: Diagnosis present

## 2022-07-06 HISTORY — DX: Unspecified osteoarthritis, unspecified site: M19.90

## 2022-07-06 LAB — CBC
HCT: 48.6 % (ref 39.0–52.0)
Hemoglobin: 16.2 g/dL (ref 13.0–17.0)
MCH: 30.6 pg (ref 26.0–34.0)
MCHC: 33.3 g/dL (ref 30.0–36.0)
MCV: 91.9 fL (ref 80.0–100.0)
Platelets: 145 10*3/uL — ABNORMAL LOW (ref 150–400)
RBC: 5.29 MIL/uL (ref 4.22–5.81)
RDW: 13.2 % (ref 11.5–15.5)
WBC: 6.3 10*3/uL (ref 4.0–10.5)
nRBC: 0 % (ref 0.0–0.2)

## 2022-07-06 LAB — COMPREHENSIVE METABOLIC PANEL
ALT: 28 U/L (ref 0–44)
AST: 20 U/L (ref 15–41)
Albumin: 3.8 g/dL (ref 3.5–5.0)
Alkaline Phosphatase: 45 U/L (ref 38–126)
Anion gap: 8 (ref 5–15)
BUN: 27 mg/dL — ABNORMAL HIGH (ref 8–23)
CO2: 27 mmol/L (ref 22–32)
Calcium: 8.9 mg/dL (ref 8.9–10.3)
Chloride: 102 mmol/L (ref 98–111)
Creatinine, Ser: 1.21 mg/dL (ref 0.61–1.24)
GFR, Estimated: >60 mL/min (ref >60)
Glucose, Bld: 95 mg/dL (ref 70–99)
Potassium: 4 mmol/L (ref 3.5–5.1)
Sodium: 137 mmol/L (ref 135–145)
Total Bilirubin: 0.8 mg/dL (ref 0.3–1.2)
Total Protein: 6.5 g/dL (ref 6.5–8.1)

## 2022-07-06 LAB — SURGICAL PCR SCREEN
MRSA, PCR: NEGATIVE
Staphylococcus aureus: POSITIVE — AB

## 2022-07-06 NOTE — Progress Notes (Signed)
COVID Vaccine Completed:  Yes x2  Date of COVID positive in last 90 days:  No  PCP - Synetta Fail, MD Cardiologist - N/A  Chest x-ray -  N/A EKG -  N/A Stress Test -  N/A ECHO -  N/A Cardiac Cath -  N/A Pacemaker/ICD device last checked: Spinal Cord Stimulator:  Bowel Prep -  N/A  Sleep Study -  N/A CPAP -   Fasting Blood Sugar -  N/A Checks Blood Sugar _____ times a day  Last dose of GLP1 agonist-  N/A GLP1 instructions:  N/A   Last dose of SGLT-2 inhibitors-  N/A SGLT-2 instructions: N/A  Blood Thinner Instructions: N/A Aspirin Instructions: Last Dose:  Activity level:  Can go up a flight of stairs and perform activities of daily living without stopping and without symptoms of chest pain or shortness of breath.  Able to exercise without symptoms, goes to gym 3 times a week   Anesthesia review:  N/A  Patient denies shortness of breath, fever, cough and chest pain at PAT appointment  Patient verbalized understanding of instructions that were given to them at the PAT appointment. Patient was also instructed that they will need to review over the PAT instructions again at home before surgery.

## 2022-07-06 NOTE — Progress Notes (Signed)
PCR results sent to Dr. Blackman to review. 

## 2022-07-12 NOTE — H&P (Signed)
TOTAL KNEE ADMISSION H&P  Patient is being admitted for left total knee arthroplasty.  Subjective:  Chief Complaint:left knee pain.  HPI: Brandon Blackwell, 71 y.o. male, has a history of pain and functional disability in the left knee due to arthritis and has failed non-surgical conservative treatments for greater than 12 weeks to includeNSAID's and/or analgesics, corticosteriod injections, viscosupplementation injections, flexibility and strengthening excercises, and activity modification.  Onset of symptoms was gradual, starting 5 years ago with gradually worsening course since that time. The patient noted no past surgery on the left knee(s).  Patient currently rates pain in the left knee(s) at 10 out of 10 with activity. Patient has night pain, worsening of pain with activity and weight bearing, pain that interferes with activities of daily living, pain with passive range of motion, crepitus, and joint swelling.  Patient has evidence of subchondral sclerosis, periarticular osteophytes, and joint space narrowing by imaging studies. There is no active infection.  Patient Active Problem List   Diagnosis Date Noted   Unilateral primary osteoarthritis, left knee 09/14/2019   Past Medical History:  Diagnosis Date   Arthritis     Past Surgical History:  Procedure Laterality Date   CATARACT EXTRACTION     NEUROMA SURGERY Bilateral    WISDOM TOOTH EXTRACTION      No current facility-administered medications for this encounter.   Current Outpatient Medications  Medication Sig Dispense Refill Last Dose   acetaminophen (TYLENOL) 325 MG tablet Take 650 mg by mouth every 6 (six) hours as needed for moderate pain.      ARTIFICIAL TEAR SOLUTION OP Place 1 drop into both eyes daily as needed (dry eyes).      Multiple Vitamin (MULTIVITAMIN WITH MINERALS) TABS tablet Take 1 tablet by mouth daily.      sildenafil (REVATIO) 20 MG tablet Take 40-100 mg by mouth as needed (ED).      tadalafil (CIALIS) 5  MG tablet TAKE ONE TABLET BY MOUTH DAILY      No Known Allergies  Social History   Tobacco Use   Smoking status: Former    Packs/day: 0.50    Years: 10.00    Total pack years: 5.00    Types: Cigarettes    Quit date: 07/31/1979    Years since quitting: 42.9   Smokeless tobacco: Never  Substance Use Topics   Alcohol use: Never    No family history on file.   Review of Systems  All other systems reviewed and are negative.   Objective:  Physical Exam Vitals reviewed.  Constitutional:      Appearance: Normal appearance. He is normal weight.  HENT:     Head: Normocephalic and atraumatic.  Eyes:     Extraocular Movements: Extraocular movements intact.     Pupils: Pupils are equal, round, and reactive to light.  Cardiovascular:     Rate and Rhythm: Normal rate and regular rhythm.     Pulses: Normal pulses.  Pulmonary:     Effort: Pulmonary effort is normal.     Breath sounds: Normal breath sounds.  Abdominal:     Palpations: Abdomen is soft.  Musculoskeletal:     Cervical back: Normal range of motion and neck supple.     Left knee: Effusion, bony tenderness and crepitus present. Decreased range of motion. Tenderness present over the medial joint line and lateral joint line. Abnormal alignment.  Neurological:     Mental Status: He is alert and oriented to person, place, and time.  Psychiatric:  Behavior: Behavior normal.     Vital signs in last 24 hours:    Labs:   Estimated body mass index is 29.54 kg/m as calculated from the following:   Height as of 07/06/22: 6' (1.829 m).   Weight as of 07/06/22: 98.8 kg.   Imaging Review Plain radiographs demonstrate severe degenerative joint disease of the left knee(s). The overall alignment ismild varus. The bone quality appears to be excellent for age and reported activity level.      Assessment/Plan:  End stage arthritis, left knee   The patient history, physical examination, clinical judgment of the  provider and imaging studies are consistent with end stage degenerative joint disease of the left knee(s) and total knee arthroplasty is deemed medically necessary. The treatment options including medical management, injection therapy arthroscopy and arthroplasty were discussed at length. The risks and benefits of total knee arthroplasty were presented and reviewed. The risks due to aseptic loosening, infection, stiffness, patella tracking problems, thromboembolic complications and other imponderables were discussed. The patient acknowledged the explanation, agreed to proceed with the plan and consent was signed. Patient is being admitted for inpatient treatment for surgery, pain control, PT, OT, prophylactic antibiotics, VTE prophylaxis, progressive ambulation and ADL's and discharge planning. The patient is planning to be discharged home with home health services

## 2022-07-13 ENCOUNTER — Other Ambulatory Visit: Payer: Self-pay

## 2022-07-13 ENCOUNTER — Observation Stay (HOSPITAL_COMMUNITY): Payer: Medicare HMO

## 2022-07-13 ENCOUNTER — Encounter (HOSPITAL_COMMUNITY)
Admission: RE | Disposition: A | Discharge status: Home Health | Payer: Self-pay | Source: Ambulatory Visit | Attending: Orthopaedic Surgery

## 2022-07-13 ENCOUNTER — Observation Stay (HOSPITAL_COMMUNITY)
Admission: RE | Admit: 2022-07-13 | Discharge: 2022-07-14 | Disposition: A | Discharge status: Home Health | Payer: Medicare HMO | Source: Ambulatory Visit | Attending: Orthopaedic Surgery | Admitting: Orthopaedic Surgery

## 2022-07-13 ENCOUNTER — Ambulatory Visit (HOSPITAL_COMMUNITY): Payer: Medicare HMO | Admitting: Anesthesiology

## 2022-07-13 ENCOUNTER — Ambulatory Visit (HOSPITAL_BASED_OUTPATIENT_CLINIC_OR_DEPARTMENT_OTHER): Payer: Medicare HMO | Admitting: Anesthesiology

## 2022-07-13 ENCOUNTER — Encounter (HOSPITAL_COMMUNITY): Payer: Self-pay | Admitting: Orthopaedic Surgery

## 2022-07-13 DIAGNOSIS — M1712 Unilateral primary osteoarthritis, left knee: Secondary | ICD-10-CM

## 2022-07-13 DIAGNOSIS — Z96652 Presence of left artificial knee joint: Secondary | ICD-10-CM

## 2022-07-13 DIAGNOSIS — Z87891 Personal history of nicotine dependence: Secondary | ICD-10-CM | POA: Diagnosis not present

## 2022-07-13 HISTORY — PX: TOTAL KNEE ARTHROPLASTY: SHX125

## 2022-07-13 LAB — TYPE AND SCREEN
ABO/RH(D): A POS
Antibody Screen: NEGATIVE

## 2022-07-13 LAB — ABO/RH: ABO/RH(D): A POS

## 2022-07-13 SURGERY — ARTHROPLASTY, KNEE, TOTAL
Anesthesia: Regional | Site: Knee | Laterality: Left

## 2022-07-13 MED ORDER — AMISULPRIDE (ANTIEMETIC) 5 MG/2ML IV SOLN: 10.0000 mg | Freq: Once | INTRAVENOUS | Status: DC | PRN

## 2022-07-13 MED ORDER — MELATONIN 3 MG PO TABS
3.0000 mg | ORAL_TABLET | Freq: Once | ORAL | Status: AC
  Administered 2022-07-13: 3 mg via ORAL
  Filled 2022-07-13: qty 1

## 2022-07-13 MED ORDER — DOCUSATE SODIUM 100 MG PO CAPS
100.0000 mg | ORAL_CAPSULE | Freq: Two times a day (BID) | ORAL | Status: DC
  Administered 2022-07-13 – 2022-07-14 (×2): 100 mg via ORAL
  Filled 2022-07-13 (×2): qty 1

## 2022-07-13 MED ORDER — OXYCODONE HCL 5 MG PO TABS
5.0000 mg | ORAL_TABLET | ORAL | Status: DC | PRN
  Administered 2022-07-13: 10 mg via ORAL
  Administered 2022-07-13: 5 mg via ORAL
  Administered 2022-07-14: 10 mg via ORAL
  Filled 2022-07-13: qty 2
  Filled 2022-07-13: qty 1
  Filled 2022-07-13: qty 2

## 2022-07-13 MED ORDER — ONDANSETRON HCL 4 MG/2ML IJ SOLN: 4.0000 mg | Freq: Once | INTRAMUSCULAR | Status: DC | PRN

## 2022-07-13 MED ORDER — DEXAMETHASONE SODIUM PHOSPHATE 4 MG/ML IJ SOLN
INTRAMUSCULAR | Status: DC | PRN
  Administered 2022-07-13: 10 mg via INTRAVENOUS

## 2022-07-13 MED ORDER — PROPOFOL 500 MG/50ML IV EMUL
INTRAVENOUS | Status: DC | PRN
  Administered 2022-07-13: 150 ug/kg/min via INTRAVENOUS

## 2022-07-13 MED ORDER — POVIDONE-IODINE 10 % EX SWAB
2.0000 | Freq: Once | CUTANEOUS | Status: AC
  Administered 2022-07-13: 2 via TOPICAL

## 2022-07-13 MED ORDER — DEXAMETHASONE SODIUM PHOSPHATE 10 MG/ML IJ SOLN
INTRAMUSCULAR | Status: AC
  Filled 2022-07-13: qty 1

## 2022-07-13 MED ORDER — ONDANSETRON HCL 4 MG/2ML IJ SOLN
INTRAMUSCULAR | Status: DC | PRN
  Administered 2022-07-13: 4 mg via INTRAVENOUS

## 2022-07-13 MED ORDER — MENTHOL 3 MG MT LOZG: 1.0000 | LOZENGE | OROMUCOSAL | Status: DC | PRN

## 2022-07-13 MED ORDER — TRANEXAMIC ACID-NACL 1000-0.7 MG/100ML-% IV SOLN
1000.0000 mg | INTRAVENOUS | Status: AC
  Administered 2022-07-13: 1000 mg via INTRAVENOUS
  Filled 2022-07-13: qty 100

## 2022-07-13 MED ORDER — FENTANYL CITRATE PF 50 MCG/ML IJ SOSY: 25.0000 ug | PREFILLED_SYRINGE | INTRAMUSCULAR | Status: DC | PRN

## 2022-07-13 MED ORDER — POLYETHYLENE GLYCOL 3350 17 G PO PACK: 17.0000 g | PACK | Freq: Every day | ORAL | Status: DC | PRN

## 2022-07-13 MED ORDER — LACTATED RINGERS IV SOLN: INTRAVENOUS | Status: DC

## 2022-07-13 MED ORDER — CEFAZOLIN SODIUM-DEXTROSE 2-4 GM/100ML-% IV SOLN
2.0000 g | INTRAVENOUS | Status: AC
  Administered 2022-07-13: 2 g via INTRAVENOUS
  Filled 2022-07-13: qty 100

## 2022-07-13 MED ORDER — ONDANSETRON HCL 4 MG/2ML IJ SOLN: 4.0000 mg | Freq: Four times a day (QID) | INTRAMUSCULAR | Status: DC | PRN

## 2022-07-13 MED ORDER — ASPIRIN 81 MG PO CHEW
81.0000 mg | CHEWABLE_TABLET | Freq: Two times a day (BID) | ORAL | Status: DC
  Administered 2022-07-14: 81 mg via ORAL
  Filled 2022-07-13: qty 1

## 2022-07-13 MED ORDER — PROPOFOL 10 MG/ML IV BOLUS
INTRAVENOUS | Status: DC | PRN
  Administered 2022-07-13 (×2): 20 mg via INTRAVENOUS

## 2022-07-13 MED ORDER — METHOCARBAMOL 1000 MG/10ML IJ SOLN: 500.0000 mg | Freq: Four times a day (QID) | INTRAVENOUS | Status: DC | PRN

## 2022-07-13 MED ORDER — DIPHENHYDRAMINE HCL 12.5 MG/5ML PO ELIX: 12.5000 mg | ORAL_SOLUTION | ORAL | Status: DC | PRN

## 2022-07-13 MED ORDER — CEFAZOLIN SODIUM-DEXTROSE 1-4 GM/50ML-% IV SOLN
1.0000 g | Freq: Four times a day (QID) | INTRAVENOUS | Status: AC
  Administered 2022-07-13 – 2022-07-14 (×2): 1 g via INTRAVENOUS
  Filled 2022-07-13 (×2): qty 50

## 2022-07-13 MED ORDER — PROPOFOL 10 MG/ML IV BOLUS
INTRAVENOUS | Status: AC
  Filled 2022-07-13: qty 20

## 2022-07-13 MED ORDER — HYDROMORPHONE HCL 1 MG/ML IJ SOLN: 0.5000 mg | INTRAMUSCULAR | Status: DC | PRN

## 2022-07-13 MED ORDER — ACETAMINOPHEN 500 MG PO TABS
1000.0000 mg | ORAL_TABLET | Freq: Once | ORAL | Status: AC
  Administered 2022-07-13: 1000 mg via ORAL
  Filled 2022-07-13: qty 2

## 2022-07-13 MED ORDER — ORAL CARE MOUTH RINSE: 15.0000 mL | Freq: Once | OROMUCOSAL | Status: AC

## 2022-07-13 MED ORDER — SODIUM CHLORIDE 0.9 % IV SOLN: INTRAVENOUS | Status: DC

## 2022-07-13 MED ORDER — ONDANSETRON HCL 4 MG/2ML IJ SOLN
INTRAMUSCULAR | Status: AC
  Filled 2022-07-13: qty 2

## 2022-07-13 MED ORDER — ONDANSETRON HCL 4 MG PO TABS: 4.0000 mg | ORAL_TABLET | Freq: Four times a day (QID) | ORAL | Status: DC | PRN

## 2022-07-13 MED ORDER — MIDAZOLAM HCL 2 MG/2ML IJ SOLN
1.0000 mg | INTRAMUSCULAR | Status: DC
  Administered 2022-07-13: 1 mg via INTRAVENOUS
  Filled 2022-07-13: qty 2

## 2022-07-13 MED ORDER — FENTANYL CITRATE (PF) 100 MCG/2ML IJ SOLN
INTRAMUSCULAR | Status: DC | PRN
  Administered 2022-07-13 (×2): 50 ug via INTRAVENOUS

## 2022-07-13 MED ORDER — STERILE WATER FOR IRRIGATION IR SOLN
Status: DC | PRN
  Administered 2022-07-13: 2000 mL

## 2022-07-13 MED ORDER — METOCLOPRAMIDE HCL 5 MG PO TABS: 5.0000 mg | ORAL_TABLET | Freq: Three times a day (TID) | ORAL | Status: DC | PRN

## 2022-07-13 MED ORDER — ALUM & MAG HYDROXIDE-SIMETH 200-200-20 MG/5ML PO SUSP: 30.0000 mL | ORAL | Status: DC | PRN

## 2022-07-13 MED ORDER — METOCLOPRAMIDE HCL 5 MG/ML IJ SOLN: 5.0000 mg | Freq: Three times a day (TID) | INTRAMUSCULAR | Status: DC | PRN

## 2022-07-13 MED ORDER — HYDROMORPHONE HCL 2 MG PO TABS: 2.0000 mg | ORAL_TABLET | ORAL | Status: DC | PRN

## 2022-07-13 MED ORDER — SODIUM CHLORIDE 0.9 % IR SOLN
Status: DC | PRN
  Administered 2022-07-13: 1000 mL

## 2022-07-13 MED ORDER — BUPIVACAINE-EPINEPHRINE (PF) 0.5% -1:200000 IJ SOLN
INTRAMUSCULAR | Status: DC | PRN
  Administered 2022-07-13: 30 mL via PERINEURAL

## 2022-07-13 MED ORDER — 0.9 % SODIUM CHLORIDE (POUR BTL) OPTIME
TOPICAL | Status: DC | PRN
  Administered 2022-07-13: 1000 mL

## 2022-07-13 MED ORDER — CHLORHEXIDINE GLUCONATE 0.12 % MT SOLN
15.0000 mL | Freq: Once | OROMUCOSAL | Status: AC
  Administered 2022-07-13: 15 mL via OROMUCOSAL

## 2022-07-13 MED ORDER — ACETAMINOPHEN 325 MG PO TABS: 325.0000 mg | ORAL_TABLET | Freq: Four times a day (QID) | ORAL | Status: DC | PRN

## 2022-07-13 MED ORDER — PHENOL 1.4 % MT LIQD: 1.0000 | OROMUCOSAL | Status: DC | PRN

## 2022-07-13 MED ORDER — PANTOPRAZOLE SODIUM 40 MG PO TBEC
40.0000 mg | DELAYED_RELEASE_TABLET | Freq: Every day | ORAL | Status: DC
  Administered 2022-07-14: 40 mg via ORAL
  Filled 2022-07-13: qty 1

## 2022-07-13 MED ORDER — FENTANYL CITRATE PF 50 MCG/ML IJ SOSY
50.0000 ug | PREFILLED_SYRINGE | INTRAMUSCULAR | Status: DC
  Filled 2022-07-13: qty 2

## 2022-07-13 MED ORDER — FENTANYL CITRATE (PF) 100 MCG/2ML IJ SOLN
INTRAMUSCULAR | Status: AC
  Filled 2022-07-13: qty 2

## 2022-07-13 MED ORDER — METHOCARBAMOL 500 MG PO TABS
500.0000 mg | ORAL_TABLET | Freq: Four times a day (QID) | ORAL | Status: DC | PRN
  Administered 2022-07-14: 500 mg via ORAL
  Filled 2022-07-13: qty 1

## 2022-07-13 MED ORDER — BUPIVACAINE IN DEXTROSE 0.75-8.25 % IT SOLN
INTRATHECAL | Status: DC | PRN
  Administered 2022-07-13: 1.6 mL via INTRATHECAL

## 2022-07-13 SURGICAL SUPPLY — 62 items
APL SKNCLS STERI-STRIP NONHPOA (GAUZE/BANDAGES/DRESSINGS)
BAG COUNTER SPONGE SURGICOUNT (BAG) IMPLANT
BAG SPEC THK2 15X12 ZIP CLS (MISCELLANEOUS) ×1
BAG SPNG CNTER NS LX DISP (BAG)
BAG ZIPLOCK 12X15 (MISCELLANEOUS) ×1 IMPLANT
BENZOIN TINCTURE PRP APPL 2/3 (GAUZE/BANDAGES/DRESSINGS) IMPLANT
BLADE SAG 18X100X1.27 (BLADE) ×1 IMPLANT
BLADE SURG SZ10 CARB STEEL (BLADE) ×2 IMPLANT
BNDG ELASTIC 6X5.8 VLCR STR LF (GAUZE/BANDAGES/DRESSINGS) ×2 IMPLANT
BOWL SMART MIX CTS (DISPOSABLE) IMPLANT
BSPLAT TIB 5D G CMNT STM LT (Knees) ×1 IMPLANT
CEMENT BONE R 1X40 (Cement) IMPLANT
CEMENT BONE SIMPLEX SPEEDSET (Cement) IMPLANT
COMP FEM CMT STD PS SZ12 LT (Joint) ×1 IMPLANT
COMPONENT FEM CMT STD  SZ12LT (Joint) IMPLANT
COOLER ICEMAN CLASSIC (MISCELLANEOUS) ×1 IMPLANT
COVER SURGICAL LIGHT HANDLE (MISCELLANEOUS) ×1 IMPLANT
CUFF TOURN SGL QUICK 34 (TOURNIQUET CUFF) ×1
CUFF TRNQT CYL 34X4.125X (TOURNIQUET CUFF) ×1 IMPLANT
DRAPE INCISE IOBAN 66X45 STRL (DRAPES) ×1 IMPLANT
DRAPE U-SHAPE 47X51 STRL (DRAPES) ×1 IMPLANT
DURAPREP 26ML APPLICATOR (WOUND CARE) ×1 IMPLANT
ELECT BLADE TIP CTD 4 INCH (ELECTRODE) ×1 IMPLANT
ELECT REM PT RETURN 15FT ADLT (MISCELLANEOUS) ×1 IMPLANT
GAUZE PAD ABD 8X10 STRL (GAUZE/BANDAGES/DRESSINGS) ×2 IMPLANT
GAUZE SPONGE 4X4 12PLY STRL (GAUZE/BANDAGES/DRESSINGS) ×1 IMPLANT
GAUZE XEROFORM 1X8 LF (GAUZE/BANDAGES/DRESSINGS) IMPLANT
GLOVE BIO SURGEON STRL SZ7.5 (GLOVE) ×1 IMPLANT
GLOVE BIOGEL PI IND STRL 8 (GLOVE) ×2 IMPLANT
GLOVE ECLIPSE 8.0 STRL XLNG CF (GLOVE) ×1 IMPLANT
GOWN STRL REUS W/ TWL XL LVL3 (GOWN DISPOSABLE) ×2 IMPLANT
GOWN STRL REUS W/TWL XL LVL3 (GOWN DISPOSABLE) ×2
HANDPIECE INTERPULSE COAX TIP (DISPOSABLE) ×1
HDLS TROCR DRIL PIN KNEE 75 (PIN) ×1
HOLDER FOLEY CATH W/STRAP (MISCELLANEOUS) IMPLANT
IMMOBILIZER KNEE 20 (SOFTGOODS) ×1
IMMOBILIZER KNEE 20 THIGH 36 (SOFTGOODS) ×1 IMPLANT
KIT TURNOVER KIT A (KITS) IMPLANT
LINER TIB ASF GH/12 10 LT (Liner) IMPLANT
NS IRRIG 1000ML POUR BTL (IV SOLUTION) ×1 IMPLANT
PACK TOTAL KNEE CUSTOM (KITS) ×1 IMPLANT
PAD COLD SHLDR WRAP-ON (PAD) ×1 IMPLANT
PADDING CAST COTTON 6X4 STRL (CAST SUPPLIES) ×2 IMPLANT
PIN DRILL HDLS TROCAR 75 4PK (PIN) IMPLANT
PROTECTOR NERVE ULNAR (MISCELLANEOUS) ×1 IMPLANT
SCREW FEMALE HEX FIX 25X2.5 (ORTHOPEDIC DISPOSABLE SUPPLIES) IMPLANT
SET HNDPC FAN SPRY TIP SCT (DISPOSABLE) ×1 IMPLANT
SET PAD KNEE POSITIONER (MISCELLANEOUS) ×1 IMPLANT
SPIKE FLUID TRANSFER (MISCELLANEOUS) IMPLANT
STAPLER VISISTAT 35W (STAPLE) IMPLANT
STEM POLY PAT PLY 38M KNEE (Knees) IMPLANT
STEM TIBIA 5 DEG SZ G L KNEE (Knees) IMPLANT
STRIP CLOSURE SKIN 1/2X4 (GAUZE/BANDAGES/DRESSINGS) IMPLANT
SUT MNCRL AB 4-0 PS2 18 (SUTURE) IMPLANT
SUT VIC AB 0 CT1 27 (SUTURE) ×1
SUT VIC AB 0 CT1 27XBRD ANTBC (SUTURE) ×1 IMPLANT
SUT VIC AB 1 CT1 36 (SUTURE) ×2 IMPLANT
SUT VIC AB 2-0 CT1 27 (SUTURE) ×2
SUT VIC AB 2-0 CT1 TAPERPNT 27 (SUTURE) ×2 IMPLANT
TIBIA STEM 5 DEG SZ G L KNEE (Knees) ×1 IMPLANT
TRAY FOLEY MTR SLVR 16FR STAT (SET/KITS/TRAYS/PACK) IMPLANT
WATER STERILE IRR 1000ML POUR (IV SOLUTION) ×2 IMPLANT

## 2022-07-13 NOTE — Op Note (Signed)
Operative Note  Date date of operation: 07/13/2022 Preoperative diagnosis: Left knee primary osteoarthritis Postoperative diagnosis: Same  Procedure: Left cemented total knee arthroplasty  Implants: Biomet/Zimmer cemented knee system with size 12 standard CR left femur, size G left tibial tray, 10 mm medial congruent polyethylene insert, 38 mm patella button  Surgeon: Vanita Panda. Magnus Ivan, MD Assistant: Rexene Edison, PA-C  Anesthesia: #1 left lower extremity regional block, #2 spinal EBL: Less than 100 cc Tourniquet time: 65 minutes Antibiotics: 2 g IV Ancef Complications: None  Indications: The patient is a very active 71 year old gentleman with well-documented primary osteoarthritis of his left knee.  He has tried and failed all forms of conservative treatment and at this point his left knee pain is daily and it is detrimentally affecting his mobility, his quality life, and his activities day living.  His x-rays showed severe arthritis of left knee.  He wishes to proceed with a knee replacement we agree with this as well given his daily pain and the x-ray findings combined with his clinical exam findings and signs symptoms.  We did talk over the risk of acute blood loss anemia, nerve vessel injury, fracture, infection, DVT, implant failure, and wound healing issues.  He understands her goals are hopefully decrease pain, improve mobility and improve quality of life.  Procedure description: After informed consent was obtained the appropriate left knee was marked, anesthesia obtained a left lower extremity adductor canal block in the holding room.  The patient was then brought to the operating room and set up on the stretcher where spinal anesthesia was obtained.  He was then laid in the supine position on the operating table.  A Foley catheter was placed.  A Nahser trach is placed around his upper left thigh and his left thigh, knee, leg, ankle and foot were prepped and draped with DuraPrep  and sterile drapes including a sterile stockinette.  A timeout was called he identified his correct patient correct left knee.  An Esmarch was then used to wrap out the leg and the tourniquet was inflated to 300 mm of pressure.  We then made incision over the patella and carried this proximally distally with the knee extended.  We dissected down to the knee joint carried out a medial parapatellar arthrotomy finding a large joint effusion.  With the knee in a flexed position we found osteophytes and complete wear of the cartilage in all 3 compartments.  We remove remnants of the ACL and medial and lateral meniscus.  Osteophytes were removed from all 3 compartments.  We then started with our extramedullary cutting guide for making her proximal tibia cut correction varus and valgus at 3 degree slope.  We made this cut to take 2 mm off the low side.  We then went to the femur and used an intramedullary cutting guide for making her distal femoral cut for left knee at 5 degrees externally rotated and a 10 mm distal femoral cut.  We brought the knee back down to full extension and with a 10 mm extension block and achieve full extension.  We then back to the femur and put our femoral sizing guide based off the epicondylar axis.  Based off of this we chose a size 12 femur.  We put a 4-in-1 cutting block for size 12 femur and made our anterior and posterior cuts followed by her chamfer cuts.  We then turned attention back to the tibia.  For the tibial tray, we chose a size G left tibial tray  and made a keel punch and drill hole off of this based off of the coverage of the tibial plateau in setting the rotation off of the tibial tubercle and femur.  We then trialed our size G left tibial tray followed by our size 12 standard CR left femur.  We placed a 10 mm medial congruent left polythene insert and we are pleased with range of motion and stability with this insert.  We then made a patella cut and drilled 3 holes for a size  38 patella button.  With all trial instrumentation in the knee we put the knee through several cycles of range of motion and we are pleased with range of motion and stability.  We then removed all instrumentation from the knee and irrigate the knee with normal saline solution.  We dried the needle well and then mixed our cement.  With the knee in a flexed position we cemented our size G Biomet Zimmer persona tibial tray for a left knee followed by our size 12 standard CR left femur.  We placed our 10 mm left medial congruent polythene insert and cemented our size 38 patella button we then held the knee fully straighten and compressed in an extended position to allow the cement hardened.  Once it hardened with the tourniquet down and hemostasis was obtained electrocautery.  The arthrotomy was then closed with interrupted #1 Vicryl suture followed by 0 Vicryl close the deep tissue and 2-0 Vicryl close the subcutaneous tissue.  The skin was closed with staples.  Well-padded sterile dressing was applied.  The patient was taken off of the operating table taken recovery in stable addition with all final counts being correct and no complications noted.  Rexene Edison, PA-C did assist the entire case and his assistance was crucial and medically necessary facilitating all aspects including soft tissue retraction and management, helping guide implant placement and a layered closure of the wound.

## 2022-07-13 NOTE — Anesthesia Postprocedure Evaluation (Signed)
Anesthesia Post Note  Patient: Kingston Duffett  Procedure(s) Performed: LEFT TOTAL KNEE ARTHROPLASTY (Left: Knee)     Patient location during evaluation: PACU Anesthesia Type: Regional and Spinal Level of consciousness: awake Pain management: pain level controlled Vital Signs Assessment: post-procedure vital signs reviewed and stable Respiratory status: spontaneous breathing, nonlabored ventilation and respiratory function stable Cardiovascular status: blood pressure returned to baseline and stable Postop Assessment: no apparent nausea or vomiting Anesthetic complications: no   No notable events documented.  Last Vitals:  Vitals:   07/13/22 1630 07/13/22 1654  BP: (!) 149/70 (!) 146/78  Pulse: (!) 57 (!) 58  Resp: 14 16  Temp:  36.5 C  SpO2: 97% 99%    Last Pain:  Vitals:   07/13/22 1654  TempSrc: Oral  PainSc: 3                  Shelley Cocke P Makenzey Nanni

## 2022-07-13 NOTE — Anesthesia Procedure Notes (Addendum)
Spinal  Patient location during procedure: OR Start time: 07/13/2022 1:04 PM End time: 07/13/2022 1:09 PM Reason for block: surgical anesthesia Staffing Performed: anesthesiologist  Anesthesiologist: Leonides Grills, MD Performed by: Leonides Grills, MD Authorized by: Leonides Grills, MD   Preanesthetic Checklist Completed: patient identified, IV checked, risks and benefits discussed, surgical consent, monitors and equipment checked, pre-op evaluation and timeout performed Spinal Block Patient position: sitting Prep: DuraPrep Patient monitoring: cardiac monitor, continuous pulse ox and blood pressure Approach: midline Location: L4-5 Injection technique: single-shot Needle Needle type: Quincke  Needle gauge: 22 G Needle length: 9 cm Assessment Sensory level: T10 Events: CSF return Additional Notes Functioning IV was confirmed and monitors were applied. Sterile prep and drape, including hand hygiene and sterile gloves were used. The patient was positioned and the spine was prepped. The skin was anesthetized with lidocaine.  Free flow of clear CSF was obtained prior to injecting local anesthetic into the CSF.  The spinal needle aspirated freely following injection.  The needle was carefully withdrawn.  The patient tolerated the procedure well.

## 2022-07-13 NOTE — Anesthesia Preprocedure Evaluation (Addendum)
Anesthesia Evaluation  Patient identified by MRN, date of birth, ID band Patient awake    Reviewed: Allergy & Precautions, NPO status , Patient's Chart, lab work & pertinent test results  Airway Mallampati: II  TM Distance: >3 FB Neck ROM: Full    Dental no notable dental hx.    Pulmonary former smoker   Pulmonary exam normal        Cardiovascular negative cardio ROS Normal cardiovascular exam     Neuro/Psych negative neurological ROS  negative psych ROS   GI/Hepatic negative GI ROS, Neg liver ROS,,,  Endo/Other  negative endocrine ROS    Renal/GU negative Renal ROS     Musculoskeletal  (+) Arthritis ,    Abdominal   Peds  Hematology negative hematology ROS (+)   Anesthesia Other Findings osteoarthritis left knee  Reproductive/Obstetrics                             Anesthesia Physical Anesthesia Plan  ASA: 2  Anesthesia Plan: Spinal and Regional   Post-op Pain Management:    Induction: Intravenous  PONV Risk Score and Plan: 1 and Ondansetron, Dexamethasone, Propofol infusion, Midazolam and Treatment may vary due to age or medical condition  Airway Management Planned: Simple Face Mask  Additional Equipment:   Intra-op Plan:   Post-operative Plan:   Informed Consent: I have reviewed the patients History and Physical, chart, labs and discussed the procedure including the risks, benefits and alternatives for the proposed anesthesia with the patient or authorized representative who has indicated his/her understanding and acceptance.     Dental advisory given  Plan Discussed with: CRNA  Anesthesia Plan Comments:        Anesthesia Quick Evaluation

## 2022-07-13 NOTE — Anesthesia Procedure Notes (Signed)
Anesthesia Regional Block: Adductor canal block   Pre-Anesthetic Checklist: , timeout performed,  Correct Patient, Correct Site, Correct Laterality,  Correct Procedure,, site marked,  Risks and benefits discussed,  Surgical consent,  Pre-op evaluation,  At surgeon's request and post-op pain management  Laterality: Left  Prep: chloraprep       Needles:  Injection technique: Single-shot  Needle Type: Echogenic Stimulator Needle     Needle Length: 9cm  Needle Gauge: 21     Additional Needles:   Procedures:,,,, ultrasound used (permanent image in chart),,    Narrative:  Start time: 07/13/2022 12:05 PM End time: 07/13/2022 12:15 PM Injection made incrementally with aspirations every 5 mL.  Performed by: Personally  Anesthesiologist: Leonides Grills, MD  Additional Notes: Functioning IV was confirmed and monitors were applied. A time-out was performed. Hand hygiene and sterile gloves were used. The thigh was placed in a frog-leg position and prepped in a sterile fashion. A 73mm 21ga Arrow echogenic stimulator needle was placed using ultrasound guidance.  Negative aspiration and negative test dose prior to incremental administration of local anesthetic. The patient tolerated the procedure well.

## 2022-07-13 NOTE — Interval H&P Note (Signed)
History and Physical Interval Note: The patient understands that he is here today for a left total knee replacement to treat his severe left knee primary osteoarthritis.  There has been no acute or interval changes in medical status.  See H&P.  The risks and benefits of surgery have been explained in detail and informed consent is obtained.  The left operative knee has been marked.  07/13/2022 11:25 AM  Truman Hayward  has presented today for surgery, with the diagnosis of osteoarthritis left knee.  The various methods of treatment have been discussed with the patient and family. After consideration of risks, benefits and other options for treatment, the patient has consented to  Procedure(s): LEFT TOTAL KNEE ARTHROPLASTY (Left) as a surgical intervention.  The patient's history has been reviewed, patient examined, no change in status, stable for surgery.  I have reviewed the patient's chart and labs.  Questions were answered to the patient's satisfaction.     Kathryne Hitch

## 2022-07-13 NOTE — Transfer of Care (Signed)
Immediate Anesthesia Transfer of Care Note  Patient: Brandon Blackwell  Procedure(s) Performed: LEFT TOTAL KNEE ARTHROPLASTY (Left: Knee)  Patient Location: PACU  Anesthesia Type:Spinal  Level of Consciousness: awake, alert , oriented, and patient cooperative  Airway & Oxygen Therapy: Patient Spontanous Breathing and Patient connected to face mask oxygen  Post-op Assessment: Report given to RN and Post -op Vital signs reviewed and stable  Post vital signs: Reviewed and stable  Last Vitals:  Vitals Value Taken Time  BP 125/67 07/13/22 1515  Temp    Pulse 66 07/13/22 1516  Resp 20 07/13/22 1516  SpO2 94 % 07/13/22 1516  Vitals shown include unvalidated device data.  Last Pain:  Vitals:   07/13/22 1215  TempSrc:   PainSc: 0-No pain         Complications: No notable events documented.

## 2022-07-13 NOTE — Evaluation (Signed)
Physical Therapy Evaluation Patient Details Name: Brandon Blackwell MRN: [id number] DOB: May 17, 1951 Today's Date: 07/13/2022  History of Present Illness  Pt is a 71yo male presenting s/p L-TKA on 07/13/22. No significant PMH.  Clinical Impression  Brandon Blackwell is a 71 y.o. male POD 0 s/p L-TKA. Patient reports independence with mobility at baseline. Patient is now limited by functional impairments (see PT problem list below) and requires supervision for bed mobility and min guard for transfers. Patient was able to ambulate 40 feet with RW and min guard level of assist. Patient instructed in exercise to facilitate ROM and circulation to manage edema. Provided incentive spirometer and with Vcs pt able to achieve . Patient will benefit from continued skilled PT interventions to address impairments and progress towards PLOF. Acute PT will follow to progress mobility and stair training in preparation for safe discharge home.       Recommendations for follow up therapy are one component of a multi-disciplinary discharge planning process, led by the attending physician.  Recommendations may be updated based on patient status, additional functional criteria and insurance authorization.  Follow Up Recommendations Follow physician's recommendations for discharge plan and follow up therapies      Assistance Recommended at Discharge Frequent or constant Supervision/Assistance  Patient can return home with the following  A little help with walking and/or transfers;A little help with bathing/dressing/bathroom;Assistance with cooking/housework;Assist for transportation;Help with stairs or ramp for entrance    Equipment Recommendations Rolling walker (2 wheels)  Recommendations for Other Services       Functional Status Assessment Patient has had a recent decline in their functional status and demonstrates the ability to make significant improvements in function in a reasonable and predictable amount  of time.     Precautions / Restrictions Precautions Precautions: Fall;Knee Precaution Booklet Issued: No Precaution Comments: no pillow under the knee Restrictions Weight Bearing Restrictions: No Other Position/Activity Restrictions: wbat      Mobility  Bed Mobility Overal bed mobility: Needs Assistance Bed Mobility: Supine to Sit     Supine to sit: Supervision, HOB elevated     General bed mobility comments: For safety only    Transfers Overall transfer level: Needs assistance Equipment used: Rolling walker (2 wheels) Transfers: Sit to/from Stand Sit to Stand: Min guard           General transfer comment: For safety only, no physical assist required, VCs for sequencing, hand placement, and powering up from bed    Ambulation/Gait Ambulation/Gait assistance: Min guard Gait Distance (Feet): 40 Feet Assistive device: Rolling walker (2 wheels) Gait Pattern/deviations: Step-to pattern Gait velocity: decreased     General Gait Details: Pt ambulated with RW and min guard, no physical assist required or overt LOB noted.  Stairs            Wheelchair Mobility    Modified Rankin (Stroke Patients Only)       Balance Overall balance assessment: Needs assistance Sitting-balance support: Feet supported, No upper extremity supported Sitting balance-Leahy Scale: Good     Standing balance support: Reliant on assistive device for balance, During functional activity, Bilateral upper extremity supported Standing balance-Leahy Scale: Poor                               Pertinent Vitals/Pain Pain Assessment Pain Assessment: No/denies pain    Home Living Family/patient expects to be discharged to:: Private residence Living Arrangements: Spouse/significant other Available Help at Discharge: Family;Available  24 hours/day Type of Home: House Home Access: Stairs to enter Entrance Stairs-Rails: Right;Left;Can reach both Entrance Stairs-Number of  Steps: 3   Home Layout: One level Home Equipment: Shower seat - built in;Cane - single point      Prior Function Prior Level of Function : Independent/Modified Independent;Driving;History of Falls (last six months)             Mobility Comments: IND ADLs Comments: IND     Hand Dominance        Extremity/Trunk Assessment   Upper Extremity Assessment Upper Extremity Assessment: Overall WFL for tasks assessed    Lower Extremity Assessment Lower Extremity Assessment: RLE deficits/detail;LLE deficits/detail RLE Deficits / Details: MMT ank DF/PF 5/5 RLE Sensation: WNL LLE Deficits / Details: MMT ank DF/PF 5/5, no extensor lag ntoed LLE Sensation: WNL    Cervical / Trunk Assessment Cervical / Trunk Assessment: Normal  Communication   Communication: No difficulties  Cognition Arousal/Alertness: Awake/alert Behavior During Therapy: WFL for tasks assessed/performed Overall Cognitive Status: Within Functional Limits for tasks assessed                                          General Comments General comments (skin integrity, edema, etc.): Wife Geraldine Contras present    Exercises Total Joint Exercises Ankle Circles/Pumps: AROM, Both, 20 reps   Assessment/Plan    PT Assessment Patient needs continued PT services  PT Problem List Decreased strength;Decreased range of motion;Decreased activity tolerance;Decreased balance;Decreased mobility;Decreased coordination;Pain       PT Treatment Interventions DME instruction;Gait training;Stair training;Functional mobility training;Therapeutic activities;Therapeutic exercise;Balance training;Neuromuscular re-education;Patient/family education    PT Goals (Current goals can be found in the Care Plan section)  Acute Rehab PT Goals Patient Stated Goal: Gym, golf PT Goal Formulation: With patient Time For Goal Achievement: 07/20/22 Potential to Achieve Goals: Good    Frequency 7X/week     Co-evaluation                AM-PAC PT "6 Clicks" Mobility  Outcome Measure Help needed turning from your back to your side while in a flat bed without using bedrails?: None Help needed moving from lying on your back to sitting on the side of a flat bed without using bedrails?: A Little Help needed moving to and from a bed to a chair (including a wheelchair)?: A Little Help needed standing up from a chair using your arms (e.g., wheelchair or bedside chair)?: A Little Help needed to walk in hospital room?: A Little Help needed climbing 3-5 steps with a railing? : A Little 6 Click Score: 19    End of Session Equipment Utilized During Treatment: Gait belt Activity Tolerance: No increased pain;Patient tolerated treatment well Patient left: in chair;with call bell/phone within reach;with chair alarm set;with family/visitor present;with SCD's reapplied Nurse Communication: Mobility status PT Visit Diagnosis: Pain;Difficulty in walking, not elsewhere classified (R26.2) Pain - Right/Left: Left Pain - part of body: Knee    Time: 5465-0354 PT Time Calculation (min) (ACUTE ONLY): 30 min   Charges:   PT Evaluation $PT Eval Low Complexity: 1 Low PT Treatments $Gait Training: 8-22 mins        Jamesetta Geralds, PT, DPT [facility] Department Office: [phone] Weekend pager: [phone]  Jamesetta Geralds 07/13/2022, 6:29 PM

## 2022-07-14 DIAGNOSIS — K913 Postprocedural intestinal obstruction, unspecified as to partial versus complete: Secondary | ICD-10-CM | POA: Diagnosis not present

## 2022-07-14 DIAGNOSIS — K56609 Unspecified intestinal obstruction, unspecified as to partial versus complete obstruction: Secondary | ICD-10-CM | POA: Diagnosis not present

## 2022-07-14 LAB — CBC
HCT: 42.4 % (ref 39.0–52.0)
Hemoglobin: 14.1 g/dL (ref 13.0–17.0)
MCH: 30.7 pg (ref 26.0–34.0)
MCHC: 33.3 g/dL (ref 30.0–36.0)
MCV: 92.2 fL (ref 80.0–100.0)
Platelets: 157 10*3/uL (ref 150–400)
RBC: 4.6 MIL/uL (ref 4.22–5.81)
RDW: 13.1 % (ref 11.5–15.5)
WBC: 12.7 10*3/uL — ABNORMAL HIGH (ref 4.0–10.5)
nRBC: 0 % (ref 0.0–0.2)

## 2022-07-14 LAB — BASIC METABOLIC PANEL
Anion gap: 8 (ref 5–15)
BUN: 33 mg/dL — ABNORMAL HIGH (ref 8–23)
CO2: 23 mmol/L (ref 22–32)
Calcium: 8.7 mg/dL — ABNORMAL LOW (ref 8.9–10.3)
Chloride: 111 mmol/L (ref 98–111)
Creatinine, Ser: 1.16 mg/dL (ref 0.61–1.24)
GFR, Estimated: >60 mL/min (ref >60)
Glucose, Bld: 184 mg/dL — ABNORMAL HIGH (ref 70–99)
Potassium: 4.4 mmol/L (ref 3.5–5.1)
Sodium: 142 mmol/L (ref 135–145)

## 2022-07-14 MED ORDER — ASPIRIN 81 MG PO CHEW: 81.0000 mg | CHEWABLE_TABLET | Freq: Two times a day (BID) | ORAL | 0 refills | Status: AC

## 2022-07-14 MED ORDER — OXYCODONE HCL 5 MG PO TABS: 5.0000 mg | ORAL_TABLET | ORAL | 0 refills | Status: DC | PRN

## 2022-07-14 MED ORDER — METHOCARBAMOL 500 MG PO TABS: 500.0000 mg | ORAL_TABLET | Freq: Four times a day (QID) | ORAL | 1 refills | Status: DC | PRN

## 2022-07-14 NOTE — Progress Notes (Signed)
Pt alert and oriented. Surgical dressing clean, dry and intact. No questions regarding discharge instructions. Belongings given back to pt,waiting for walker to be delivered to room.

## 2022-07-14 NOTE — Plan of Care (Signed)

## 2022-07-14 NOTE — Plan of Care (Signed)

## 2022-07-14 NOTE — TOC Transition Note (Signed)
Transition of Care [facility]) - CM/SW Discharge Note   Patient Details  Name: Brandon Blackwell MRN: [id number] Date of Birth: 04/14/1951  [facility] CM/SW Contact:  Larrie Kass, LCSW Phone Number: 07/14/2022, 9:46 AM   Clinical Narrative:     Pt confirms he needs RW no agency preference , referral set to [facility] pt was set up with [facility] for [facility] services through doctor's office. No additional TOC needs .  Final next level of care: Home w Home Health Services Barriers to Discharge: Barriers Resolved   Patient Goals and CMS Choice Patient states their goals for this hospitalization and ongoing recovery are:: retun home CMS Medicare.gov Compare Post Acute Care list provided to:: Patient Choice offered to / list presented to : Patient  Discharge Placement                       Discharge Plan and Services                DME Arranged: Walker rolling DME Agency: [facility] Date DME Agency Contacted: 07/14/22 Time DME Agency Contacted: [phone] Representative spoke with at DME Agency: jasmine HH Arranged: PT, OT HH Agency: [facility]        Social Determinants of Health (SDOH) Interventions Food Insecurity Interventions: Intervention Not Indicated Housing Interventions: Intervention Not Indicated Transportation Interventions: Intervention Not Indicated Utilities Interventions: Intervention Not Indicated   Readmission Risk Interventions     No data to display

## 2022-07-14 NOTE — Discharge Summary (Signed)
Patient ID: Brandon Blackwell MRN: [id number] DOB/AGE: 1951/07/08 71 y.o.  Admit date: 07/13/2022 Discharge date: 07/14/2022  Admission Diagnoses:  Principal Problem:   Unilateral primary osteoarthritis, left knee Active Problems:   Status post total left knee replacement   Discharge Diagnoses:  Same  Past Medical History:  Diagnosis Date   Arthritis     Surgeries: Procedure(s): LEFT TOTAL KNEE ARTHROPLASTY on 07/13/2022   Consultants:   Discharged Condition: Improved  Hospital Course: Brandon Blackwell is an 71 y.o. male who was admitted 07/13/2022 for operative treatment ofUnilateral primary osteoarthritis, left knee. Patient has severe unremitting pain that affects sleep, daily activities, and work/hobbies. After pre-op clearance the patient was taken to the operating room on 07/13/2022 and underwent  Procedure(s): LEFT TOTAL KNEE ARTHROPLASTY.    Patient was given perioperative antibiotics:  Anti-infectives (From admission, onward)    Start     Dose/Rate Route Frequency Ordered Stop   07/13/22 1900  ceFAZolin (ANCEF) IVPB 1 g/50 mL premix        1 g 100 mL/hr over 30 Minutes Intravenous Every 6 hours 07/13/22 1708 07/14/22 0120   07/13/22 1015  ceFAZolin (ANCEF) IVPB 2g/100 mL premix        2 g 200 mL/hr over 30 Minutes Intravenous On call to O.R. 07/13/22 1005 07/13/22 1308        Patient was given sequential compression devices, early ambulation, and chemoprophylaxis to prevent DVT.  Patient benefited maximally from hospital stay and there were no complications.    Recent vital signs: Patient Vitals for the past 24 hrs:  BP Temp Temp src Pulse Resp SpO2 Height Weight  07/14/22 0454 125/66 98.7 F (37.1 C) Oral 75 18 95 % -- --  07/14/22 0132 128/72 98.6 F (37 C) -- 76 18 94 % -- --  07/13/22 2153 (!) 144/84 98.4 F (36.9 C) Oral 78 18 98 % -- --  07/13/22 1859 134/78 98.6 F (37 C) Oral 66 18 96 % -- --  07/13/22 1654 (!) 146/78 97.7 F (36.5 C) Oral  (!) 58 16 99 % -- --  07/13/22 1630 (!) 149/70 -- -- (!) 57 14 97 % -- --  07/13/22 1615 125/68 -- -- (!) 57 12 97 % -- --  07/13/22 1600 (!) 143/80 -- -- (!) 54 14 99 % -- --  07/13/22 1545 121/70 -- -- (!) 57 12 97 % -- --  07/13/22 1530 122/72 -- -- (!) 57 14 94 % -- --  07/13/22 1515 125/67 97.6 F (36.4 C) -- 68 20 94 % -- --  07/13/22 1215 136/87 -- -- (!) 58 17 98 % -- --  07/13/22 1210 (!) 154/76 -- -- 60 14 99 % -- --  07/13/22 1205 (!) 151/85 -- -- (!) 56 17 98 % -- --  07/13/22 1032 -- -- -- -- -- -- 6' (1.829 m) 98.8 kg  07/13/22 1011 (!) 151/87 98 F (36.7 C) Oral 69 16 97 % -- --     Recent laboratory studies:  Recent Labs    07/14/22 0417  WBC 12.7*  HGB 14.1  HCT 42.4  PLT 157  NA 142  K 4.4  CL 111  CO2 23  BUN 33*  CREATININE 1.16  GLUCOSE 184*  CALCIUM 8.7*     Discharge Medications:   Allergies as of 07/14/2022   No Known Allergies      Medication List     TAKE these medications    acetaminophen 325 MG tablet  Commonly known as: TYLENOL Take 650 mg by mouth every 6 (six) hours as needed for moderate pain.   ARTIFICIAL TEAR SOLUTION OP Place 1 drop into both eyes daily as needed (dry eyes).   aspirin 81 MG chewable tablet Chew 1 tablet (81 mg total) by mouth 2 (two) times daily.   methocarbamol 500 MG tablet Commonly known as: ROBAXIN Take 1 tablet (500 mg total) by mouth every 6 (six) hours as needed for muscle spasms.   multivitamin with minerals Tabs tablet Take 1 tablet by mouth daily.   oxyCODONE 5 MG immediate release tablet Commonly known as: Oxy IR/ROXICODONE Take 1-2 tablets (5-10 mg total) by mouth every 4 (four) hours as needed for moderate pain (pain score 4-6).   sildenafil 20 MG tablet Commonly known as: REVATIO Take 40-100 mg by mouth as needed (ED).   tadalafil 5 MG tablet Commonly known as: CIALIS TAKE ONE TABLET BY MOUTH DAILY               Durable Medical Equipment  (From admission, onward)            Start     Ordered   07/13/22 1709  DME 3 n 1  Once        07/13/22 1708   07/13/22 1709  DME Walker rolling  Once       Question Answer Comment  Walker: With 5 Inch Wheels   Patient needs a walker to treat with the following condition Status post total left knee replacement      07/13/22 1708            Diagnostic Studies: DG Knee Left Port  Result Date: 07/13/2022 CLINICAL DATA:  Status post total left knee replacement EXAM: PORTABLE LEFT KNEE - 1-2 VIEW COMPARISON:  Radiograph 06/18/2022. FINDINGS: There is a 3 component total knee arthroplasty in normal alignment without evidence of loosening or periprosthetic fracture. Expected soft tissue changes including a joint effusion. IMPRESSION: Left total knee arthroplasty in normal alignment without evidence of immediate hardware complication. Electronically Signed   By: Caprice Renshaw M.D.   On: 07/13/2022 16:30   XR Knee 1-2 Views Left  Result Date: 06/18/2022 2 views of the left knee standing show varus malalignment with severe tricompartment arthritis.  This is worsened from previous films.  There is complete loss of joint space medially and patellofemoral joint.  There are osteophytes in all 3 compartments.   Disposition: Discharge disposition: 01-Home or Self Care          Follow-up Information     Kathryne Hitch, MD Follow up in 2 week(s).   Specialty: Orthopedic Surgery Contact information: [address] [place] [address] [phone]                  Signed: Kathryne Hitch 07/14/2022, 8:24 AM

## 2022-07-14 NOTE — Progress Notes (Signed)
Subjective: 1 Day Post-Op Procedure(s) (LRB): LEFT TOTAL KNEE ARTHROPLASTY (Left) Patient reports pain as moderate.    Objective: Vital signs in last 24 hours: Temp:  [97.6 F (36.4 C)-98.7 F (37.1 C)] 98.7 F (37.1 C) (12/16 0454) Pulse Rate:  [54-78] 75 (12/16 0454) Resp:  [12-20] 18 (12/16 0454) BP: (121-154)/(66-87) 125/66 (12/16 0454) SpO2:  [94 %-99 %] 95 % (12/16 0454) Weight:  [98.8 kg] 98.8 kg (12/15 1032)  Intake/Output from previous day: 12/15 0701 - 12/16 0700 In: 2145.2 [P.O.:120; I.V.:1727.9; IV Piggyback:297.3] Out: 1600 [Urine:1550; Blood:50] Intake/Output this shift: No intake/output data recorded.  Recent Labs    07/14/22 0417  HGB 14.1   Recent Labs    07/14/22 0417  WBC 12.7*  RBC 4.60  HCT 42.4  PLT 157   Recent Labs    07/14/22 0417  NA 142  K 4.4  CL 111  CO2 23  BUN 33*  CREATININE 1.16  GLUCOSE 184*  CALCIUM 8.7*   No results for input(s): "LABPT", "INR" in the last 72 hours.  Sensation intact distally Intact pulses distally Dorsiflexion/Plantar flexion intact Incision: dressing C/D/I Compartment soft   Assessment/Plan: 1 Day Post-Op Procedure(s) (LRB): LEFT TOTAL KNEE ARTHROPLASTY (Left) Up with therapy Discharge home with home health      Kathryne Hitch 07/14/2022, 8:22 AM

## 2022-07-14 NOTE — Discharge Instructions (Signed)

## 2022-07-14 NOTE — Progress Notes (Signed)
Physical Therapy Treatment Patient Details Name: Brandon Blackwell MRN: [id number] DOB: [birth year] Today's Date: 07/14/2022   History of Present Illness Pt is a 71yo male presenting s/p L-TKA on 07/13/22. No significant PMH.    PT Comments    Pt seen POD1 received supine in bed without pain complaints. Demonstrated modified independence for bed mobility, supervision for transfers, and min guard for ambulation in hallway with RW 231f. Pt completed stair training with min guard. Provided HEP and pt completed exercises with safe form and minimal cuing, with minimal cuing, occasional use of gait belt to self-assist. All education completed and pt has no further questions. Pt has met mobility goals for safe discharge home, PT is signing off, should needs change please reconsult. Thank you for this referral.    Recommendations for follow up therapy are one component of a multi-disciplinary discharge planning process, led by the attending physician.  Recommendations may be updated based on patient status, additional functional criteria and insurance authorization.  Follow Up Recommendations  Follow physician's recommendations for discharge plan and follow up therapies     Assistance Recommended at Discharge Frequent or constant Supervision/Assistance  Patient can return home with the following A little help with walking and/or transfers;A little help with bathing/dressing/bathroom;Assistance with cooking/housework;Assist for transportation;Help with stairs or ramp for entrance   Equipment Recommendations  Rolling walker (2 wheels)    Recommendations for Other Services       Precautions / Restrictions Precautions Precautions: Fall;Knee Precaution Booklet Issued: No Precaution Comments: no pillow under the knee Restrictions Weight Bearing Restrictions: No Other Position/Activity Restrictions: wbat     Mobility  Bed Mobility Overal bed mobility: Needs Assistance Bed Mobility: Supine to  Sit     Supine to sit: HOB elevated, Modified independent (Device/Increase time)     General bed mobility comments: Increased time    Transfers Overall transfer level: Needs assistance Equipment used: Rolling walker (2 wheels) Transfers: Sit to/from Stand Sit to Stand: Supervision           General transfer comment: Supervision for safety    Ambulation/Gait Ambulation/Gait assistance: Min guard Gait Distance (Feet): 200 Feet Assistive device: Rolling walker (2 wheels) Gait Pattern/deviations: Step-to pattern Gait velocity: decreased     General Gait Details: Pt ambulated with RW and min guard, no physical assist required or overt LOB noted.   Stairs Stairs: Yes Stairs assistance: Min guard Stair Management: Two rails, Step to pattern, Forwards Number of Stairs: 2 General stair comments: Pt completed stair training with min guard, no overt LOB noted or physical assist required   Wheelchair Mobility    Modified Rankin (Stroke Patients Only)       Balance Overall balance assessment: Needs assistance Sitting-balance support: Feet supported, No upper extremity supported Sitting balance-Leahy Scale: Good     Standing balance support: Reliant on assistive device for balance, During functional activity, Bilateral upper extremity supported Standing balance-Leahy Scale: Poor                              Cognition Arousal/Alertness: Awake/alert Behavior During Therapy: WFL for tasks assessed/performed Overall Cognitive Status: Within Functional Limits for tasks assessed                                          Exercises Total Joint Exercises Ankle Circles/Pumps: AROM, Both, 20 reps  Quad Sets: AROM, Left, 10 reps Towel Squeeze: AROM, Both, 10 reps Short Arc Quad: AROM, Left, 10 reps Heel Slides: AROM, Left, 10 reps Hip ABduction/ADduction: AROM, Left, 10 reps Straight Leg Raises: AAROM, Left, 10 reps Goniometric ROM: -5-40deg  by gross visual approximation    General Comments General comments (skin integrity, edema, etc.): Wife dee present      Pertinent Vitals/Pain Pain Assessment Pain Assessment: 0-10 Pain Score: 5  Pain Location: left knee Pain Descriptors / Indicators: Operative site guarding Pain Intervention(s): Monitored during session, Limited activity within patient's tolerance, Repositioned, Ice applied    Home Living                          Prior Function            PT Goals (current goals can now be found in the care plan section) Acute Rehab PT Goals Patient Stated Goal: Gym, golf PT Goal Formulation: With patient Time For Goal Achievement: 07/20/22 Potential to Achieve Goals: Good Progress towards PT goals: Progressing toward goals    Frequency    7X/week      PT Plan Current plan remains appropriate    Co-evaluation              AM-PAC PT "6 Clicks" Mobility   Outcome Measure  Help needed turning from your back to your side while in a flat bed without using bedrails?: None Help needed moving from lying on your back to sitting on the side of a flat bed without using bedrails?: A Little Help needed moving to and from a bed to a chair (including a wheelchair)?: A Little Help needed standing up from a chair using your arms (e.g., wheelchair or bedside chair)?: A Little Help needed to walk in hospital room?: A Little Help needed climbing 3-5 steps with a railing? : A Little 6 Click Score: 19    End of Session Equipment Utilized During Treatment: Gait belt Activity Tolerance: No increased pain;Patient tolerated treatment well Patient left: in chair;with call bell/phone within reach;with chair alarm set;with family/visitor present Nurse Communication: Mobility status PT Visit Diagnosis: Pain;Difficulty in walking, not elsewhere classified (R26.2) Pain - Right/Left: Left Pain - part of body: Knee     Time: 7616-0737 PT Time Calculation (min) (ACUTE  ONLY): 28 min  Charges:  $Gait Training: 8-22 mins $Therapeutic Exercise: 8-22 mins                     Coolidge Breeze, PT, DPT [facility] Office: [phone] Weekend pager: [phone]   Coolidge Breeze 07/14/2022, 9:41 AM

## 2022-07-16 ENCOUNTER — Other Ambulatory Visit: Payer: Self-pay | Admitting: Physician Assistant

## 2022-07-16 ENCOUNTER — Telehealth: Payer: Self-pay

## 2022-07-16 ENCOUNTER — Encounter (HOSPITAL_COMMUNITY): Payer: Self-pay | Admitting: Orthopaedic Surgery

## 2022-07-16 MED ORDER — ONDANSETRON HCL 4 MG PO TABS: 4.0000 mg | ORAL_TABLET | Freq: Three times a day (TID) | ORAL | 0 refills | Status: AC | PRN

## 2022-07-16 NOTE — Telephone Encounter (Signed)
Patient called in stating that he has been sick to his stomach all weekend he called the on call doc they told him to take tums with no help. Patient would like to know if he can have some nausea meds sent in to the pharm ? [facility] in [place].

## 2022-07-17 ENCOUNTER — Inpatient Hospital Stay (HOSPITAL_BASED_OUTPATIENT_CLINIC_OR_DEPARTMENT_OTHER)
Admission: EM | Admit: 2022-07-17 | Discharge: 2022-07-20 | DRG: 388 | Disposition: A | Discharge status: Home / Self Care | Payer: Medicare HMO | Attending: Student | Admitting: Student

## 2022-07-17 ENCOUNTER — Other Ambulatory Visit: Payer: Self-pay

## 2022-07-17 ENCOUNTER — Emergency Department (HOSPITAL_BASED_OUTPATIENT_CLINIC_OR_DEPARTMENT_OTHER): Payer: Medicare HMO

## 2022-07-17 ENCOUNTER — Encounter (HOSPITAL_COMMUNITY): Payer: Self-pay

## 2022-07-17 ENCOUNTER — Encounter (HOSPITAL_BASED_OUTPATIENT_CLINIC_OR_DEPARTMENT_OTHER): Payer: Self-pay | Admitting: Emergency Medicine

## 2022-07-17 DIAGNOSIS — Z1152 Encounter for screening for COVID-19: Secondary | ICD-10-CM

## 2022-07-17 DIAGNOSIS — Y838 Other surgical procedures as the cause of abnormal reaction of the patient, or of later complication, without mention of misadventure at the time of the procedure: Secondary | ICD-10-CM | POA: Diagnosis present

## 2022-07-17 DIAGNOSIS — N1831 Chronic kidney disease, stage 3a: Secondary | ICD-10-CM | POA: Diagnosis not present

## 2022-07-17 DIAGNOSIS — N183 Chronic kidney disease, stage 3 unspecified: Secondary | ICD-10-CM | POA: Diagnosis present

## 2022-07-17 DIAGNOSIS — Z96652 Presence of left artificial knee joint: Secondary | ICD-10-CM

## 2022-07-17 DIAGNOSIS — Z7982 Long term (current) use of aspirin: Secondary | ICD-10-CM | POA: Diagnosis not present

## 2022-07-17 DIAGNOSIS — Z79899 Other long term (current) drug therapy: Secondary | ICD-10-CM

## 2022-07-17 DIAGNOSIS — Z87891 Personal history of nicotine dependence: Secondary | ICD-10-CM | POA: Diagnosis not present

## 2022-07-17 DIAGNOSIS — K913 Postprocedural intestinal obstruction, unspecified as to partial versus complete: Principal | ICD-10-CM | POA: Diagnosis present

## 2022-07-17 DIAGNOSIS — N179 Acute kidney failure, unspecified: Secondary | ICD-10-CM | POA: Diagnosis not present

## 2022-07-17 DIAGNOSIS — N400 Enlarged prostate without lower urinary tract symptoms: Secondary | ICD-10-CM | POA: Diagnosis present

## 2022-07-17 DIAGNOSIS — J690 Pneumonitis due to inhalation of food and vomit: Secondary | ICD-10-CM | POA: Diagnosis not present

## 2022-07-17 DIAGNOSIS — K56609 Unspecified intestinal obstruction, unspecified as to partial versus complete obstruction: Secondary | ICD-10-CM | POA: Diagnosis not present

## 2022-07-17 DIAGNOSIS — K219 Gastro-esophageal reflux disease without esophagitis: Secondary | ICD-10-CM | POA: Diagnosis present

## 2022-07-17 DIAGNOSIS — J9811 Atelectasis: Secondary | ICD-10-CM | POA: Diagnosis present

## 2022-07-17 LAB — CBC WITH DIFFERENTIAL/PLATELET
Abs Immature Granulocytes: 0.02 10*3/uL (ref 0.00–0.07)
Basophils Absolute: 0 10*3/uL (ref 0.0–0.1)
Basophils Relative: 0 %
Eosinophils Absolute: 0 10*3/uL (ref 0.0–0.5)
Eosinophils Relative: 0 %
HCT: 41.8 % (ref 39.0–52.0)
Hemoglobin: 13.7 g/dL (ref 13.0–17.0)
Immature Granulocytes: 0 %
Lymphocytes Relative: 7 %
Lymphs Abs: 0.7 10*3/uL (ref 0.7–4.0)
MCH: 29.8 pg (ref 26.0–34.0)
MCHC: 32.8 g/dL (ref 30.0–36.0)
MCV: 90.9 fL (ref 80.0–100.0)
Monocytes Absolute: 1 10*3/uL (ref 0.1–1.0)
Monocytes Relative: 11 %
Neutro Abs: 7.3 10*3/uL (ref 1.7–7.7)
Neutrophils Relative %: 82 %
Platelets: 215 10*3/uL (ref 150–400)
RBC: 4.6 MIL/uL (ref 4.22–5.81)
RDW: 13.3 % (ref 11.5–15.5)
WBC: 9.1 10*3/uL (ref 4.0–10.5)
nRBC: 0 % (ref 0.0–0.2)

## 2022-07-17 LAB — CBC
HCT: 38.9 % — ABNORMAL LOW (ref 39.0–52.0)
Hemoglobin: 12.5 g/dL — ABNORMAL LOW (ref 13.0–17.0)
MCH: 30.1 pg (ref 26.0–34.0)
MCHC: 32.1 g/dL (ref 30.0–36.0)
MCV: 93.7 fL (ref 80.0–100.0)
Platelets: 208 10*3/uL (ref 150–400)
RBC: 4.15 MIL/uL — ABNORMAL LOW (ref 4.22–5.81)
RDW: 13.3 % (ref 11.5–15.5)
WBC: 6.6 10*3/uL (ref 4.0–10.5)
nRBC: 0 % (ref 0.0–0.2)

## 2022-07-17 LAB — BASIC METABOLIC PANEL
Anion gap: 12 (ref 5–15)
BUN: 49 mg/dL — ABNORMAL HIGH (ref 8–23)
CO2: 28 mmol/L (ref 22–32)
Calcium: 9.2 mg/dL (ref 8.9–10.3)
Chloride: 101 mmol/L (ref 98–111)
Creatinine, Ser: 1.47 mg/dL — ABNORMAL HIGH (ref 0.61–1.24)
GFR, Estimated: 50 mL/min — ABNORMAL LOW (ref >60)
Glucose, Bld: 150 mg/dL — ABNORMAL HIGH (ref 70–99)
Potassium: 4 mmol/L (ref 3.5–5.1)
Sodium: 141 mmol/L (ref 135–145)

## 2022-07-17 LAB — CREATININE, SERUM
Creatinine, Ser: 1.31 mg/dL — ABNORMAL HIGH (ref 0.61–1.24)
GFR, Estimated: 57 mL/min — ABNORMAL LOW (ref >60)

## 2022-07-17 LAB — URINALYSIS, ROUTINE W REFLEX MICROSCOPIC
Bilirubin Urine: NEGATIVE
Glucose, UA: NEGATIVE mg/dL
Hgb urine dipstick: NEGATIVE
Ketones, ur: NEGATIVE mg/dL
Leukocytes,Ua: NEGATIVE
Nitrite: NEGATIVE
Protein, ur: 100 mg/dL — AB
Specific Gravity, Urine: 1.01 (ref 1.005–1.030)
pH: 5 (ref 5.0–8.0)

## 2022-07-17 LAB — HEPATIC FUNCTION PANEL
ALT: 74 U/L — ABNORMAL HIGH (ref 0–44)
AST: 38 U/L (ref 15–41)
Albumin: 3.5 g/dL (ref 3.5–5.0)
Alkaline Phosphatase: 53 U/L (ref 38–126)
Bilirubin, Direct: 0.4 mg/dL — ABNORMAL HIGH (ref 0.0–0.2)
Indirect Bilirubin: 1.4 mg/dL — ABNORMAL HIGH (ref 0.3–0.9)
Total Bilirubin: 1.8 mg/dL — ABNORMAL HIGH (ref 0.3–1.2)
Total Protein: 7 g/dL (ref 6.5–8.1)

## 2022-07-17 LAB — MAGNESIUM: Magnesium: 2.3 mg/dL (ref 1.7–2.4)

## 2022-07-17 LAB — URINALYSIS, MICROSCOPIC (REFLEX)

## 2022-07-17 LAB — LACTIC ACID, PLASMA: Lactic Acid, Venous: 1.2 mmol/L (ref 0.5–1.9)

## 2022-07-17 LAB — LIPASE, BLOOD: Lipase: 26 U/L (ref 11–51)

## 2022-07-17 LAB — TSH: TSH: 1.021 u[IU]/mL (ref 0.350–4.500)

## 2022-07-17 LAB — PHOSPHORUS: Phosphorus: 3.4 mg/dL (ref 2.5–4.6)

## 2022-07-17 MED ORDER — SODIUM CHLORIDE 0.9 % IV SOLN: INTRAVENOUS | Status: DC | PRN

## 2022-07-17 MED ORDER — LACTATED RINGERS IV BOLUS
1000.0000 mL | Freq: Once | INTRAVENOUS | Status: AC
  Administered 2022-07-17: 1000 mL via INTRAVENOUS

## 2022-07-17 MED ORDER — ALBUTEROL SULFATE (2.5 MG/3ML) 0.083% IN NEBU: 2.5000 mg | INHALATION_SOLUTION | RESPIRATORY_TRACT | Status: DC | PRN

## 2022-07-17 MED ORDER — LACTATED RINGERS IV SOLN: INTRAVENOUS | Status: DC

## 2022-07-17 MED ORDER — IOHEXOL 300 MG/ML  SOLN
100.0000 mL | Freq: Once | INTRAMUSCULAR | Status: AC | PRN
  Administered 2022-07-17: 100 mL via INTRAVENOUS

## 2022-07-17 MED ORDER — ONDANSETRON HCL 4 MG/2ML IJ SOLN
4.0000 mg | Freq: Once | INTRAMUSCULAR | Status: AC
  Administered 2022-07-17: 4 mg via INTRAVENOUS
  Filled 2022-07-17: qty 2

## 2022-07-17 MED ORDER — ACETAMINOPHEN 10 MG/ML IV SOLN
1000.0000 mg | Freq: Four times a day (QID) | INTRAVENOUS | Status: AC | PRN
  Administered 2022-07-18: 1000 mg via INTRAVENOUS
  Filled 2022-07-17: qty 100

## 2022-07-17 MED ORDER — PIPERACILLIN-TAZOBACTAM 3.375 G IVPB
3.3750 g | Freq: Three times a day (TID) | INTRAVENOUS | Status: DC
  Administered 2022-07-17 – 2022-07-18 (×2): 3.375 g via INTRAVENOUS
  Filled 2022-07-17 (×3): qty 50

## 2022-07-17 MED ORDER — ARTIFICIAL TEARS OPHTHALMIC OINT: TOPICAL_OINTMENT | OPHTHALMIC | Status: DC | PRN

## 2022-07-17 MED ORDER — HEPARIN SODIUM (PORCINE) 5000 UNIT/ML IJ SOLN
5000.0000 [IU] | Freq: Three times a day (TID) | INTRAMUSCULAR | Status: DC
  Administered 2022-07-17 – 2022-07-19 (×6): 5000 [IU] via SUBCUTANEOUS
  Filled 2022-07-17 (×8): qty 1

## 2022-07-17 MED ORDER — ONDANSETRON HCL 4 MG PO TABS: 4.0000 mg | ORAL_TABLET | Freq: Four times a day (QID) | ORAL | Status: DC | PRN

## 2022-07-17 MED ORDER — SODIUM CHLORIDE 0.9 % IV SOLN: INTRAVENOUS | Status: DC

## 2022-07-17 MED ORDER — ONDANSETRON HCL 4 MG/2ML IJ SOLN
4.0000 mg | Freq: Four times a day (QID) | INTRAMUSCULAR | Status: DC | PRN
  Administered 2022-07-17 (×2): 4 mg via INTRAVENOUS
  Filled 2022-07-17: qty 2

## 2022-07-17 MED ORDER — METOCLOPRAMIDE HCL 5 MG/ML IJ SOLN
10.0000 mg | Freq: Once | INTRAMUSCULAR | Status: AC
  Administered 2022-07-17: 10 mg via INTRAVENOUS
  Filled 2022-07-17: qty 2

## 2022-07-17 NOTE — ED Notes (Signed)
Pt ambulatory to bathroom with rolling walker, standby assistance.

## 2022-07-17 NOTE — ED Triage Notes (Signed)
Pt states he had a knee replacement on Friday was discharged on Saturday and that evening he started vomiting  Pt has had very little to eat since then  Emesis is brown liquid  Pt has not had a BM since surgery

## 2022-07-17 NOTE — ED Notes (Signed)
[facility] at bedside 

## 2022-07-17 NOTE — Progress Notes (Signed)
Pt provided flutter valve for use.  Pt demonstrated good technique and effort with device.

## 2022-07-17 NOTE — ED Provider Notes (Signed)
[facility] EMERGENCY DEPARTMENT Provider Note   CSN: [id number] Arrival date & time: 07/17/22  0447     History  Chief Complaint  Patient presents with   Vomiting    Brandon Blackwell is a 71 y.o. male.  Patient is a 71 year old male with no significant medical issues except for recent left knee replacement on Friday of last week presenting today with abdominal distention, nausea, vomiting and no bowel movements since his surgery.  Symptoms started about Saturday after discharge from the hospital.  They called the orthopedist yesterday and were given a prescription for Zofran which she did take and reported the nausea improved however then later on that day it returned and he had a large episode of emesis last night around 12.  He has not taken any pain medication since Saturday and has only been using Tylenol but is still not able to have a bowel movement and has had very minimal oral intake.  Urine is starting to look very dark and overall he is just feeling unwell.  No fevers, chest pain or shortness of breath.  No prior history of abdominal surgeries or abdominal issues.  The history is provided by the patient.       Home Medications Prior to Admission medications   Medication Sig Start Date End Date Taking? Authorizing Provider  acetaminophen (TYLENOL) 325 MG tablet Take 650 mg by mouth every 6 (six) hours as needed for moderate pain.    [provider]  ARTIFICIAL TEAR SOLUTION OP Place 1 drop into both eyes daily as needed (dry eyes).    [provider]  aspirin 81 MG chewable tablet Chew 1 tablet (81 mg total) by mouth 2 (two) times daily. 07/14/22   Kathryne Hitch, MD  methocarbamol (ROBAXIN) 500 MG tablet Take 1 tablet (500 mg total) by mouth every 6 (six) hours as needed for muscle spasms. 07/14/22   Kathryne Hitch, MD  Multiple Vitamin (MULTIVITAMIN WITH MINERALS) TABS tablet Take 1 tablet by mouth daily.    [provider]  ondansetron (ZOFRAN) 4 MG tablet Take 1 tablet (4 mg total) by mouth every 8 (eight) hours as needed for nausea or vomiting. 07/16/22   Kirtland Bouchard, PA-C  oxyCODONE (OXY IR/ROXICODONE) 5 MG immediate release tablet Take 1-2 tablets (5-10 mg total) by mouth every 4 (four) hours as needed for moderate pain (pain score 4-6). 07/14/22   Kathryne Hitch, MD  sildenafil (REVATIO) 20 MG tablet Take 40-100 mg by mouth as needed (ED). 01/15/19   [provider]  tadalafil (CIALIS) 5 MG tablet TAKE ONE TABLET BY MOUTH DAILY 08/24/19   [provider]      Allergies    Patient has no known allergies.    Review of Systems   Review of Systems  Physical Exam Updated Vital Signs BP 133/79   Pulse 89   Temp 97.9 F (36.6 C) (Oral)   Resp 16   Ht 6' (1.829 m)   Wt 95.3 kg   SpO2 92%   BMI 28.48 kg/m  Physical Exam Vitals and nursing note reviewed.  Constitutional:      General: He is not in acute distress.    Appearance: He is well-developed.  HENT:     Head: Normocephalic and atraumatic.  Eyes:     Conjunctiva/sclera: Conjunctivae normal.     Pupils: Pupils are equal, round, and reactive to light.  Cardiovascular:     Rate and Rhythm: Regular rhythm. Tachycardia  present.     Heart sounds: No murmur heard. Pulmonary:     Effort: Pulmonary effort is normal. No respiratory distress.     Breath sounds: Normal breath sounds. No wheezing or rales.  Abdominal:     General: Bowel sounds are absent. There is distension.     Palpations: Abdomen is soft.     Tenderness: There is generalized abdominal tenderness. There is no guarding or rebound.     Comments: Minimal generalized tenderness but no rebound or guarding  Musculoskeletal:        General: No tenderness. Normal range of motion.     Cervical back: Normal range of motion and neck supple.     Comments: Left knee and brace and dressing intact  Skin:    General: Skin is warm and dry.      Findings: No erythema or rash.  Neurological:     Mental Status: He is alert and oriented to person, place, and time. Mental status is at baseline.  Psychiatric:        Mood and Affect: Mood normal.        Behavior: Behavior normal.     ED Results / Procedures / Treatments   Labs (all labs ordered are listed, but only abnormal results are displayed) Labs Reviewed  BASIC METABOLIC PANEL - Abnormal; Notable for the following components:      Result Value   Glucose, Bld 150 (*)    BUN 49 (*)    Creatinine, Ser 1.47 (*)    GFR, Estimated 50 (*)    All other components within normal limits  CBC WITH DIFFERENTIAL/PLATELET  URINALYSIS, ROUTINE W REFLEX MICROSCOPIC  HEPATIC FUNCTION PANEL  LIPASE, BLOOD    EKG None  Radiology CT ABDOMEN PELVIS W CONTRAST  Result Date: 07/17/2022 CLINICAL DATA:  Vomiting Saturday night, suspected bowel obstruction, recent LEFT total knee arthroplasty EXAM: CT ABDOMEN AND PELVIS WITH CONTRAST TECHNIQUE: Multidetector CT imaging of the abdomen and pelvis was performed using the standard protocol following bolus administration of intravenous contrast. RADIATION DOSE REDUCTION: This exam was performed according to the departmental dose-optimization program which includes automated exposure control, adjustment of the mA and/or kV according to patient size and/or use of iterative reconstruction technique. CONTRAST:  OMNIPAQUE IOHEXOL 300 MG/ML SOLN IV. No oral contrast. COMPARISON:  None Available. FINDINGS: Lower chest: Infiltrates and atelectasis in BILATERAL lower lobes Hepatobiliary: Gallbladder unremarkable. Fatty infiltration of liver. No biliary dilatation. Pancreas: Normal appearance Spleen: Normal appearance Adrenals/Urinary Tract: Adrenal glands normal appearance. BILATERAL simple appearing renal cysts up to 3.9 x 3.0 cm RIGHT and 2.0 x 1.8 cm LEFT; no follow-up imaging recommended. No urinary tract calcification or dilatation. Ureters and bladder  unremarkable. Stomach/Bowel: Distended stomach and proximal small bowel loops with decompressed distal small bowel loops consistent with small bowel obstruction. Transition from dilated to nondilated small bowel occurs in RIGHT mid abdomen. No obvious mass or wall thickening at transition site. Diverticulosis of descending and sigmoid colon without evidence of diverticulitis. Scattered diverticula proximal colon. Normal appendix. Vascular/Lymphatic: Atherosclerotic calcifications aorta. No adenopathy. Reproductive: Enlarged prostate gland 5.1 x 4.3 x 6.6 cm, including significant enlargement of the central lobe elevating and indenting bladder base. Nonspecific focus LEFT of midline 13 x 11 x 13 mm. Seminal vesicles unremarkable. Other: No free air or free fluid.  No hernia. Musculoskeletal: Degenerative disc disease changes lumbar spine. IMPRESSION: Small bowel obstruction with transition zone in RIGHT mid abdomen, of uncertain etiology. Distal colonic diverticulosis without evidence of  diverticulitis. Fatty infiltration of liver. Enlarged prostate gland including significant enlargement of the central lobe elevating and indenting bladder base. Infiltrates and atelectasis in BILATERAL lower lobes. Aortic Atherosclerosis (ICD10-I70.0). Electronically Signed   By: Ulyses Southward M.D.   On: 07/17/2022 08:47    Procedures Procedures    Medications Ordered in ED Medications  lactated ringers infusion ( Intravenous New Bag/Given 07/17/22 0916)  lactated ringers bolus 1,000 mL (0 mLs Intravenous Stopped 07/17/22 0911)  metoCLOPramide (REGLAN) injection 10 mg (10 mg Intravenous Given 07/17/22 0808)  iohexol (OMNIPAQUE) 300 MG/ML solution 100 mL (100 mLs Intravenous Contrast Given 07/17/22 0821)  lactated ringers bolus 1,000 mL (0 mLs Intravenous Stopped 07/17/22 1610)    ED Course/ Medical Decision Making/ A&P                           Medical Decision Making Amount and/or Complexity of Data  Reviewed Independent Historian: spouse External Data Reviewed: notes. Labs: ordered. Decision-making details documented in ED Course. Radiology: ordered and independent interpretation performed. Decision-making details documented in ED Course.  Risk Prescription drug management. Decision regarding hospitalization.   Pt  presenting today with a complaint that caries a high risk for morbidity and mortality.  Patient symptoms today concerning for possible small bowel obstruction versus ileus status post recent anesthesia.  No focal tenderness to suggest appendicitis, diverticulitis, perforation or pancreatitis/hepatitis.  Lower suspicion for cardiac or pulmonary process.  Patient does also appear to be dehydrated.  I independently interpreted patient's labs and CBC within normal limits, BMP with mild AKI today with creatinine of 1.47 which is greater than his baseline but normal electrolytes.  Will add on LFTs and lipase.  Patient given IV fluids and CT scan for further evaluation.  Given Reglan for nausea.  9:17 AM I have independently visualized and interpreted pt's images today.  CT of the abdomen pelvis today shows evidence of small bowel obstruction.  Radiology reports a transition point in the right mid abdomen of uncertain etiology.  Patient did have a colonoscopy last year with some polyps removed but otherwise had no significant findings.  He has never had abdominal surgery.  Suspect it may have come from the recent surgery and anesthesia.  Patient will continue on IV fluids.  Will consult general surgery and hospitalist for admission.  Patient is not currently vomiting and improved after Reglan.  Findings discussed with the patient and his wife.  They are agreeable to this plan.          Final Clinical Impression(s) / ED Diagnoses Final diagnoses:  Small bowel obstruction [facility])    Rx / DC Orders ED Discharge Orders     None         Gwyneth Sprout, MD 07/17/22 [phone]

## 2022-07-17 NOTE — ED Notes (Signed)
Pt reports one small formed bowel movement while in bathroom.

## 2022-07-17 NOTE — Progress Notes (Signed)
Plan of Care Note for accepted transfer   Patient: Brandon Blackwell MRN: [id number]   DOA: 07/17/2022  Facility requesting transfer: [facility].  Requesting Provider: Gwyneth Sprout, ND. Reason for transfer: SBO. Facility course:  Per Dr. Anitra Lauth:  Chief Complaint  Patient presents with   Vomiting      Brandon Blackwell is a 71 y.o. male.   Patient is a 71 year old male with no significant medical issues except for recent left knee replacement on Friday of last week presenting today with abdominal distention, nausea, vomiting and no bowel movements since his surgery.  Symptoms started about Saturday after discharge from the hospital.  They called the orthopedist yesterday and were given a prescription for Zofran which she did take and reported the nausea improved however then later on that day it returned and he had a large episode of emesis last night around 12.  He has not taken any pain medication since Saturday and has only been using Tylenol but is still not able to have a bowel movement and has had very minimal oral intake.  Urine is starting to look very dark and overall he is just feeling unwell.  No fevers, chest pain or shortness of breath.  No prior history of abdominal surgeries or abdominal issues.   The history is provided by the patient.   Lab work:   Component Value Units  Hepatic function panel [id number] (Abnormal)   Collected: 07/17/22 0809   Updated: 07/17/22 0919   Specimen Type: Blood    Total Protein 7.0 g/dL   Albumin 3.5 g/dL   AST 38 U/L   ALT 74 High  U/L   Alkaline Phosphatase 53 U/L   Total Bilirubin 1.8 High  mg/dL   Bilirubin, Direct 0.4 High  mg/dL   Indirect Bilirubin 1.4 High  mg/dL  Lipase, blood [[id number]]   Collected: 07/17/22 0809   Updated: 07/17/22 0919   Specimen Type: Blood    Lipase 26 U/L  Basic metabolic panel [[id number]] (Abnormal)   Collected: 07/17/22 0534   Updated: 07/17/22 0553   Specimen Type: Blood    Specimen Source: Vein    Sodium 141 mmol/L   Potassium 4.0 mmol/L   Chloride 101 mmol/L   CO2 28 mmol/L   Glucose, Bld 150 High  mg/dL   BUN 49 High  mg/dL   Creatinine, Ser 6.64 High  mg/dL   Calcium 9.2 mg/dL   GFR, Estimated 50 Low  mL/min   Anion gap 12  CBC with Differential [[id number]]   Collected: 07/17/22 0534   Updated: 07/17/22 0541   Specimen Type: Blood   Specimen Source: Vein    WBC 9.1 K/uL   RBC 4.60 MIL/uL   Hemoglobin 13.7 g/dL   HCT 56.3 %   MCV 87.5 fL   MCH 29.8 pg   MCHC 32.8 g/dL   RDW 64.3 %   Platelets 215 K/uL   nRBC 0.0 %   Neutrophils Relative % 82 %   Neutro Abs 7.3 K/uL   Lymphocytes Relative 7 %   Lymphs Abs 0.7 K/uL   Monocytes Relative 11 %   Monocytes Absolute 1.0 K/uL   Eosinophils Relative 0 %   Eosinophils Absolute 0.0 K/uL   Basophils Relative 0 %   Basophils Absolute 0.0 K/uL   Immature Granulocytes 0 %   Abs Immature Granulocytes 0.02 K/uL   Imaging:  IMPRESSION: Small bowel obstruction with transition zone in RIGHT mid abdomen, of uncertain etiology.   Distal colonic diverticulosis  without evidence of diverticulitis.   Fatty infiltration of liver.   Enlarged prostate gland including significant enlargement of the central lobe elevating and indenting bladder base.   Infiltrates and atelectasis in BILATERAL lower lobes.   Aortic Atherosclerosis (ICD10-I70.0).     Electronically Signed   By: Ulyses Southward M.D.   On: 07/17/2022 08:47  Plan of care: The patient is accepted for admission to Med-surg  unit, at [facility].  Author: Bobette Mo, MD 07/17/2022  Check [email or web address] for on-call coverage.  Nursing staff, Please call [facility] Admits & Consults System-Wide number on Amion as soon as patient's arrival, so appropriate admitting provider can evaluate the pt.

## 2022-07-17 NOTE — H&P (Signed)
History and Physical    Brandon Blackwell VPX:[id number] DOB: 1951-04-14 DOA: 07/17/2022  PCP: Malka So., MD  Patient coming from:   I have personally briefly reviewed patient's old medical records in [facility]  Chief Complaint: vomiting   HPI: Brandon Blackwell is a 71 y.o. male with only significant medical history significant of  arthritis s/p TKR 4 days ago , who presents to ED with progressive vomiting and abdominal pain x the last 3 days. He notes since his surgery he had not has bowel movement. He notes no fever/chills . But has been unable to have bowel movement or tolerate po since onset of symptoms. Patient currently note decompressed and still noted nausea and emesis. He initially refused decompression but now is amendable to NG tube placement. He notes + abdominal pain. He denies chills, but was noted to have fever in ED. He notes no sob or cough. Per wife and patient they  have note    ED Course:  In ED patient found to have SBO , thought to be related to possible paralytic ileus  related to medication / anesthesia.   Tmx 100.5, bp 153/87, hr 92-102 , sat 90-94% on ra  Labs Wbc 9.1, hgb 13.7, plt 215  Gluco 150, cr 1.47 (1.16)  Tb 1.8, alt 74,  UA neg Mag  CTAB  IMPRESSION: Small bowel obstruction with transition zone in RIGHT mid abdomen, of uncertain etiology.   Distal colonic diverticulosis without evidence of diverticulitis.   Fatty infiltration of liver.   Enlarged prostate gland including significant enlargement of the central lobe elevating and indenting bladder base.   Infiltrates and atelectasis in BILATERAL lower lobes.  Tx regaln , LR1L Review of Systems: As per HPI otherwise 10 point review of systems negative.   Past Medical History:  Diagnosis Date   Arthritis     Past Surgical History:  Procedure Laterality Date   CATARACT EXTRACTION     NEUROMA SURGERY Bilateral    TOTAL KNEE ARTHROPLASTY Left 07/13/2022   Procedure: LEFT TOTAL  KNEE ARTHROPLASTY;  Surgeon: Kathryne Hitch, MD;  Location: [facility];  Service: Orthopedics;  Laterality: Left;   WISDOM TOOTH EXTRACTION       reports that he quit smoking about 42 years ago. His smoking use included cigarettes. He has a 5.00 pack-year smoking history. He has never used smokeless tobacco. He reports that he does not drink alcohol and does not use drugs.  No Known Allergies  History reviewed. No pertinent family history.  Prior to Admission medications   Medication Sig Start Date End Date Taking? Authorizing Provider  acetaminophen (TYLENOL) 325 MG tablet Take 650 mg by mouth every 6 (six) hours as needed for moderate pain.    [provider]  ARTIFICIAL TEAR SOLUTION OP Place 1 drop into both eyes daily as needed (dry eyes).    [provider]  aspirin 81 MG chewable tablet Chew 1 tablet (81 mg total) by mouth 2 (two) times daily. 07/14/22   Kathryne Hitch, MD  methocarbamol (ROBAXIN) 500 MG tablet Take 1 tablet (500 mg total) by mouth every 6 (six) hours as needed for muscle spasms. 07/14/22   Kathryne Hitch, MD  Multiple Vitamin (MULTIVITAMIN WITH MINERALS) TABS tablet Take 1 tablet by mouth daily.    [provider]  ondansetron (ZOFRAN) 4 MG tablet Take 1 tablet (4 mg total) by mouth every 8 (eight) hours as needed for nausea or vomiting. 07/16/22   Richardean Canal  W, PA-C  oxyCODONE (OXY IR/ROXICODONE) 5 MG immediate release tablet Take 1-2 tablets (5-10 mg total) by mouth every 4 (four) hours as needed for moderate pain (pain score 4-6). 07/14/22   Kathryne Hitch, MD  sildenafil (REVATIO) 20 MG tablet Take 40-100 mg by mouth as needed (ED). 01/15/19   [provider]  tadalafil (CIALIS) 5 MG tablet TAKE ONE TABLET BY MOUTH DAILY 08/24/19   [provider]    Physical Exam: Vitals:   07/17/22 1500 07/17/22 1600 07/17/22 1800 07/17/22 1929  BP: (!) 147/77 (!) 143/78 (!) 146/77 (!) 153/87   Pulse: 92 80 80 92  Resp: 15 15 19    Temp: 98.7 F (37.1 C) 98.7 F (37.1 C)  (!) 100.5 F (38.1 C)  TempSrc:    Oral  SpO2: 97% 95% 96% 94%  Weight:      Height:        Constitutional: NAD, calm, mild discomfort Vitals:   07/17/22 1500 07/17/22 1600 07/17/22 1800 07/17/22 1929  BP: (!) 147/77 (!) 143/78 (!) 146/77 (!) 153/87  Pulse: 92 80 80 92  Resp: 15 15 19    Temp: 98.7 F (37.1 C) 98.7 F (37.1 C)  (!) 100.5 F (38.1 C)  TempSrc:    Oral  SpO2: 97% 95% 96% 94%  Weight:      Height:       Eyes: PERRL, lids and conjunctivae normal ENMT: Mucous membranes are moist. Posterior pharynx clear of any exudate or lesions.Normal dentition.  Neck: normal, supple, no masses, no thyromegaly Respiratory: clear to auscultation bilaterally, no wheezing, no crackles. Normal respiratory effort. No accessory muscle use.  Cardiovascular: Regular rate and rhythm, no murmurs / rubs / gallops. No extremity edema. 2+ pedal pulses.  Abdomen: + tenderness, +distention no masses palpated. No hepatosplenomegaly. No appreciable positive.  Musculoskeletal: no clubbing / cyanosis. No joint deformity upper and lower extremities. Good ROM, no contractures. Normal muscle tone. ( TKR left , dressing c/d/I swelling appropriate for post surgical status) Skin: no rashes, lesions, ulcers. No induration Neurologic: CN 2-12 grossly intact. Sensation intact, DTR normal. Strength 5/5 in all 4.  Psychiatric: Normal judgment and insight. Alert and oriented x 3. Normal mood.    Labs on Admission: I have personally reviewed following labs and imaging studies  CBC: Recent Labs  Lab 07/14/22 0417 07/17/22 0534  WBC 12.7* 9.1  NEUTROABS  --  7.3  HGB 14.1 13.7  HCT 42.4 41.8  MCV 92.2 90.9  PLT 157 215   Basic Metabolic Panel: Recent Labs  Lab 07/14/22 0417 07/17/22 0534  NA 142 141  K 4.4 4.0  CL 111 101  CO2 23 28  GLUCOSE 184* 150*  BUN 33* 49*  CREATININE 1.16 1.47*  CALCIUM 8.7* 9.2  MG   --  2.3  PHOS  --  3.4   GFR: Estimated Creatinine Clearance: 53.6 mL/min (A) (by C-G formula based on SCr of 1.47 mg/dL (H)). Liver Function Tests: Recent Labs  Lab 07/17/22 0809  AST 38  ALT 74*  ALKPHOS 53  BILITOT 1.8*  PROT 7.0  ALBUMIN 3.5   Recent Labs  Lab 07/17/22 0809  LIPASE 26   No results for input(s): "AMMONIA" in the last 168 hours. Coagulation Profile: No results for input(s): "INR", "PROTIME" in the last 168 hours. Cardiac Enzymes: No results for input(s): "CKTOTAL", "CKMB", "CKMBINDEX", "TROPONINI" in the last 168 hours. BNP (last 3 results) No results for input(s): "PROBNP" in the last 8760 hours. HbA1C:  No results for input(s): "HGBA1C" in the last 72 hours. CBG: No results for input(s): "GLUCAP" in the last 168 hours. Lipid Profile: No results for input(s): "CHOL", "HDL", "LDLCALC", "TRIG", "CHOLHDL", "LDLDIRECT" in the last 72 hours. Thyroid Function Tests: No results for input(s): "TSH", "T4TOTAL", "FREET4", "T3FREE", "THYROIDAB" in the last 72 hours. Anemia Panel: No results for input(s): "VITAMINB12", "FOLATE", "FERRITIN", "TIBC", "IRON", "RETICCTPCT" in the last 72 hours. Urine analysis:    Component Value Date/Time   COLORURINE YELLOW 07/17/2022 1330   APPEARANCEUR CLEAR 07/17/2022 1330   LABSPEC 1.010 07/17/2022 1330   PHURINE 5.0 07/17/2022 1330   GLUCOSEU NEGATIVE 07/17/2022 1330   HGBUR NEGATIVE 07/17/2022 1330   BILIRUBINUR NEGATIVE 07/17/2022 1330   KETONESUR NEGATIVE 07/17/2022 1330   PROTEINUR 100 (A) 07/17/2022 1330   NITRITE NEGATIVE 07/17/2022 1330   LEUKOCYTESUR NEGATIVE 07/17/2022 1330    Radiological Exams on Admission: CT ABDOMEN PELVIS W CONTRAST  Result Date: 07/17/2022 CLINICAL DATA:  Vomiting Saturday night, suspected bowel obstruction, recent LEFT total knee arthroplasty EXAM: CT ABDOMEN AND PELVIS WITH CONTRAST TECHNIQUE: Multidetector CT imaging of the abdomen and pelvis was performed using the standard  protocol following bolus administration of intravenous contrast. RADIATION DOSE REDUCTION: This exam was performed according to the departmental dose-optimization program which includes automated exposure control, adjustment of the mA and/or kV according to patient size and/or use of iterative reconstruction technique. CONTRAST:  OMNIPAQUE IOHEXOL 300 MG/ML SOLN IV. No oral contrast. COMPARISON:  None Available. FINDINGS: Lower chest: Infiltrates and atelectasis in BILATERAL lower lobes Hepatobiliary: Gallbladder unremarkable. Fatty infiltration of liver. No biliary dilatation. Pancreas: Normal appearance Spleen: Normal appearance Adrenals/Urinary Tract: Adrenal glands normal appearance. BILATERAL simple appearing renal cysts up to 3.9 x 3.0 cm RIGHT and 2.0 x 1.8 cm LEFT; no follow-up imaging recommended. No urinary tract calcification or dilatation. Ureters and bladder unremarkable. Stomach/Bowel: Distended stomach and proximal small bowel loops with decompressed distal small bowel loops consistent with small bowel obstruction. Transition from dilated to nondilated small bowel occurs in RIGHT mid abdomen. No obvious mass or wall thickening at transition site. Diverticulosis of descending and sigmoid colon without evidence of diverticulitis. Scattered diverticula proximal colon. Normal appendix. Vascular/Lymphatic: Atherosclerotic calcifications aorta. No adenopathy. Reproductive: Enlarged prostate gland 5.1 x 4.3 x 6.6 cm, including significant enlargement of the central lobe elevating and indenting bladder base. Nonspecific focus LEFT of midline 13 x 11 x 13 mm. Seminal vesicles unremarkable. Other: No free air or free fluid.  No hernia. Musculoskeletal: Degenerative disc disease changes lumbar spine. IMPRESSION: Small bowel obstruction with transition zone in RIGHT mid abdomen, of uncertain etiology. Distal colonic diverticulosis without evidence of diverticulitis. Fatty infiltration of liver. Enlarged  prostate gland including significant enlargement of the central lobe elevating and indenting bladder base. Infiltrates and atelectasis in BILATERAL lower lobes. Aortic Atherosclerosis (ICD10-I70.0). Electronically Signed   By: Ulyses Southward M.D.   On: 07/17/2022 08:47    EKG: Independently reviewed.   Assessment/Plan  Small bowel obstruction  -admit to tele -npo/ NG decompression  -supportive care ivfs/zofran/ pain medication  -avoid opioids as able -stable electrolytes continue to monitor   AKI   -due volume loss/third spacing  -continue ivfs  -hold nephrotoxic medications   Infiltrate on CXR  -fever, lower sat than baseline -? Post -op/aspiration pna  -zosyn  -f/u on blood cultures  -monitor on continuous pulse ox  -pulmonary toilet -prn nebs     TKR,recent 4 days ago  -PT/OT -supportive care  DVT prophylaxis: Heparin  Code Status: full  Family Communication: none at bedside Disposition Plan: patient  expected to be admitted greater than 2 midnights  Consults called:  Surgery Jeronimo NormaMatt [place]  Admission status: med tele   Lurline DelSara-Maiz A Eugean Arnott MD [facility]   If 7PM-7AM, please contact night-coverage [email or web address] Password [email or web address]  07/17/2022, 8:12 PM

## 2022-07-17 NOTE — ED Notes (Signed)
ED Provider at bedside. 

## 2022-07-17 NOTE — ED Notes (Signed)
Patient transported to CT 

## 2022-07-17 NOTE — Consult Note (Incomplete)
Brandon Blackwell Feb 26, 1951  Tuttle:[id number].    Requesting MD: Dr. Blanchie Dessert Chief Complaint/Reason for Consult: SBO  HPI: Brandon Blackwell is a 71 y.o. male who presented to [facility] ED for abdominal distension. Patient recently underwent a total left knee replacement on 07/13/22 by Dr. Ninfa Linden. He reports since discharge on Saturday, 07/14/22, he has been having abdominal distension, nausea, vomiting, and poor po intake. He denies flatus *** or BM since surgery. He has tried zofran for this with some improvement initially but then had a large episode of emesis late last night prompting him to come to the ED. He has only been taking Tylenol for pain since discharge. Denies associated fever or chills. No prior abdominal surgeries. No prior history of SBO. Denies hx He takes 81mg  ASA daily but otherwise is not on any blood thinners. No colonoscopy or endoscopy on record.   In the ED patient was initially tachycardic but this responded well to IVF with HR now in the 80's. Afebrile without hypotension since arrival. WBC wnl at 9.1. He does have an AKI w/ Cr 1.47 from baseline of 1.16. CT was concerning for SBO. He was transferred to [facility] for evaluation ***.   In Care Everywhere I was able to review PCP's note. It appears only other PMHx included BPH for which he is taking a low-dose tadalafil daily.  ROS: ROS As above, see hpi  History reviewed. No pertinent family history.  Past Medical History:  Diagnosis Date   Arthritis     Past Surgical History:  Procedure Laterality Date   CATARACT EXTRACTION     NEUROMA SURGERY Bilateral    TOTAL KNEE ARTHROPLASTY Left 07/13/2022   Procedure: LEFT TOTAL KNEE ARTHROPLASTY;  Surgeon: Mcarthur Rossetti, MD;  Location: [facility];  Service: Orthopedics;  Laterality: Left;   WISDOM TOOTH EXTRACTION      Social History:  reports that he quit smoking about 42 years ago. His smoking use included cigarettes. He has a 5.00 pack-year smoking history. He  has never used smokeless tobacco. He reports that he does not drink alcohol and does not use drugs.  Allergies: No Known Allergies  (Not in a hospital admission)    Physical Exam: Blood pressure (!) 147/78, pulse 89, temperature 98.6 F (37 C), resp. rate 16, height 6' (1.829 m), weight 95.3 kg, SpO2 91 %. General: pleasant, WD/WN male who is laying in bed in NAD HEENT: head is normocephalic, atraumatic.  Sclera are noninjected.  PERRL.  Ears and nose without any masses or lesions.  Mouth is pink and moist. Dentition fair Heart: regular, rate, and rhythm.  Palpable pedal pulses bilaterally  Lungs: CTAB, no wheezes, rhonchi, or rales noted.  Respiratory effort nonlabored Abd: *** Soft, NT/ND, +BS. No masses, hernias, or organomegaly MS: no BUE or BLE edema, calves soft and nontender Skin: warm and dry  Psych: A&Ox4 with an appropriate affect Neuro: cranial nerves grossly intact, normal speech, thought process intact, moves all extremities, gait not assessed  Results for orders placed or performed during the hospital encounter of 07/17/22 (from the past 48 hour(s))  CBC with Differential     Status: None   Collection Time: 07/17/22  5:34 AM  Result Value Ref Range   WBC 9.1 4.0 - 10.5 K/uL   RBC 4.60 4.22 - 5.81 MIL/uL   Hemoglobin 13.7 13.0 - 17.0 g/dL   HCT 41.8 39.0 - 52.0 %   MCV 90.9 80.0 - 100.0 fL   MCH 29.8 26.0 -  34.0 pg   MCHC 32.8 30.0 - 36.0 g/dL   RDW 13.3 11.5 - 15.5 %   Platelets 215 150 - 400 K/uL   nRBC 0.0 0.0 - 0.2 %   Neutrophils Relative % 82 %   Neutro Abs 7.3 1.7 - 7.7 K/uL   Lymphocytes Relative 7 %   Lymphs Abs 0.7 0.7 - 4.0 K/uL   Monocytes Relative 11 %   Monocytes Absolute 1.0 0.1 - 1.0 K/uL   Eosinophils Relative 0 %   Eosinophils Absolute 0.0 0.0 - 0.5 K/uL   Basophils Relative 0 %   Basophils Absolute 0.0 0.0 - 0.1 K/uL   Immature Granulocytes 0 %   Abs Immature Granulocytes 0.02 0.00 - 0.07 K/uL    Comment: Performed at [facility], [address] [place], [place] [id number]  Basic metabolic panel     Status: Abnormal   Collection Time: 07/17/22  5:34 AM  Result Value Ref Range   Sodium 141 135 - 145 mmol/L   Potassium 4.0 3.5 - 5.1 mmol/L   Chloride 101 98 - 111 mmol/L   CO2 28 22 - 32 mmol/L   Glucose, Bld 150 (H) 70 - 99 mg/dL    Comment: Glucose reference range applies only to samples taken after fasting for at least 8 hours.   BUN 49 (H) 8 - 23 mg/dL   Creatinine, Ser 1.47 (H) 0.61 - 1.24 mg/dL   Calcium 9.2 8.9 - 10.3 mg/dL   GFR, Estimated 50 (L) >60 mL/min    Comment: (NOTE) Calculated using the CKD-EPI Creatinine Equation (2021)    Anion gap 12 5 - 15    Comment: Performed at [facility], [address] [place], [place] [address]  Magnesium     Status: None   Collection Time: 07/17/22  5:34 AM  Result Value Ref Range   Magnesium 2.3 1.7 - 2.4 mg/dL    Comment: Performed at [facility], [address] [place], [place] [address]  Phosphorus     Status: None   Collection Time: 07/17/22  5:34 AM  Result Value Ref Range   Phosphorus 3.4 2.5 - 4.6 mg/dL    Comment: Performed at [facility], [address] [place], [place] [address]  Hepatic function panel     Status: Abnormal   Collection Time: 07/17/22  8:09 AM  Result Value Ref Range   Total Protein 7.0 6.5 - 8.1 g/dL   Albumin 3.5 3.5 - 5.0 g/dL   AST 38 15 - 41 U/L   ALT 74 (H) 0 - 44 U/L   Alkaline Phosphatase 53 38 - 126 U/L   Total Bilirubin 1.8 (H) 0.3 - 1.2 mg/dL   Bilirubin, Direct 0.4 (H) 0.0 - 0.2 mg/dL   Indirect Bilirubin 1.4 (H) 0.3 - 0.9 mg/dL    Comment: Performed at [facility], [address] [place], [place] [address]  Lipase, blood     Status: None   Collection Time: 07/17/22  8:09 AM  Result Value Ref Range   Lipase 26 11 - 51 U/L    Comment: Performed at [facility], [address] [place], [place] [address]   CT ABDOMEN PELVIS W CONTRAST  Result  Date: 07/17/2022 CLINICAL DATA:  Vomiting Saturday night, suspected bowel obstruction, recent LEFT total knee arthroplasty EXAM: CT ABDOMEN AND PELVIS WITH CONTRAST TECHNIQUE: Multidetector CT imaging of the abdomen and  pelvis was performed using the standard protocol following bolus administration of intravenous contrast. RADIATION DOSE REDUCTION: This exam was performed according to the departmental dose-optimization program which includes automated exposure control, adjustment of the mA and/or kV according to patient size and/or use of iterative reconstruction technique. CONTRAST:  134mL OMNIPAQUE IOHEXOL 300 MG/ML SOLN IV. No oral contrast. COMPARISON:  None Available. FINDINGS: Lower chest: Infiltrates and atelectasis in BILATERAL lower lobes Hepatobiliary: Gallbladder unremarkable. Fatty infiltration of liver. No biliary dilatation. Pancreas: Normal appearance Spleen: Normal appearance Adrenals/Urinary Tract: Adrenal glands normal appearance. BILATERAL simple appearing renal cysts up to 3.9 x 3.0 cm RIGHT and 2.0 x 1.8 cm LEFT; no follow-up imaging recommended. No urinary tract calcification or dilatation. Ureters and bladder unremarkable. Stomach/Bowel: Distended stomach and proximal small bowel loops with decompressed distal small bowel loops consistent with small bowel obstruction. Transition from dilated to nondilated small bowel occurs in RIGHT mid abdomen. No obvious mass or wall thickening at transition site. Diverticulosis of descending and sigmoid colon without evidence of diverticulitis. Scattered diverticula proximal colon. Normal appendix. Vascular/Lymphatic: Atherosclerotic calcifications aorta. No adenopathy. Reproductive: Enlarged prostate gland 5.1 x 4.3 x 6.6 cm, including significant enlargement of the central lobe elevating and indenting bladder base. Nonspecific focus LEFT of midline 13 x 11 x 13 mm. Seminal vesicles unremarkable. Other: No free air or free fluid.  No hernia.  Musculoskeletal: Degenerative disc disease changes lumbar spine. IMPRESSION: Small bowel obstruction with transition zone in RIGHT mid abdomen, of uncertain etiology. Distal colonic diverticulosis without evidence of diverticulitis. Fatty infiltration of liver. Enlarged prostate gland including significant enlargement of the central lobe elevating and indenting bladder base. Infiltrates and atelectasis in BILATERAL lower lobes. Aortic Atherosclerosis (ICD10-I70.0). Electronically Signed   By: Lavonia Dana M.D.   On: 07/17/2022 08:47    Anti-infectives (From admission, onward)    None       Assessment/Plan SBO Patient has been seen and examined. Vitals, labs, imaging, I/O, and available notes reviewed. This is a 71 y.o. male who presented with abdominal distension, n/v and no bowel function since 12/16. His history is more consistent with an ileus given his recent left total knee replacement on 12/15. However his CT does appear to have dilated loops with a transition in the RLQ that is concerning for an SBO. His stomach also appears dilated with decompressed duodenum before becoming dilated again; unclear the etiology of this. The patient has no prior abdominal surgeries or typical risk factors for an SBO. While I do not think he needs emergency surgery currently (afebrile, tachycardia resolved, no hypotension, wbc wnl, no peritonitis on exam, no free fluid or free air on CT) - if he does not resolve with conservative management quickly or was to worsen, would have low threshold to take to the OR for at least a diagnostic laparoscopy. For now would recommend keeping patient NPO, place NGT for decompression and starting SBO protocol. Keep K > 4, Mg > 2, and mobilize as able for bowel function. We will follow with you closely. Agree with admission to [facility].   FEN - NPO, place NGT, IVF per [facility] VTE - SCDs, okay for chemical prophylaxis from a general surgery standpoint ID - None indicated from our  standpoint Foley - None Dispo - Admit to [facility]. We will follow with you.   I reviewed {Reviewed data:26882::"last 24 h vitals and pain scores","last 48 h intake and output","last 24 h labs and trends","last 24 h imaging results"}  ***, [facility]  07/17/2022, 10:20 AM Please see Amion for pager number during day hours 7:00am-4:30pm

## 2022-07-17 NOTE — ED Notes (Signed)
Report given to carelink 

## 2022-07-17 NOTE — Progress Notes (Signed)
Pharmacy Antibiotic Note  Deondre Marinaro is a 71 y.o. male s/p left TKA on 12/15 who presented to the ED on 07/17/2022 with c/o abdominal distension and n/v. Abdominal CT showed SBO and bilateral infiltrates/atelectasis. Pharmacy has been consulted to dose zosyn for suspected aspiration pna.  Plan: - zosyn 3.375 gm IV q8h (infuse over 4 hrs) - with crcl >30, pharmacy will sign off for zosyn. Re-consult [place] if need further assistance.  ______________________________________  Height: 6' (182.9 cm) Weight: 95.3 kg (210 lb) IBW/kg (Calculated) : 77.6  Temp (24hrs), Avg:98.8 F (37.1 C), Min:97.9 F (36.6 C), Max:100.5 F (38.1 C)  Recent Labs  Lab 07/14/22 0417 07/17/22 0534  WBC 12.7* 9.1  CREATININE 1.16 1.47*    Estimated Creatinine Clearance: 53.6 mL/min (A) (by C-G formula based on SCr of 1.47 mg/dL (H)).    No Known Allergies   Thank you for allowing pharmacy to be a part of this patient's care.  Lucia Gaskins 07/17/2022 9:23 PM

## 2022-07-17 NOTE — ED Notes (Signed)
Pt updated to current bed assignment status, all questions addressed.  Call bell within reach, will continue to monitor.

## 2022-07-18 ENCOUNTER — Inpatient Hospital Stay (HOSPITAL_COMMUNITY): Payer: Medicare HMO

## 2022-07-18 DIAGNOSIS — N179 Acute kidney failure, unspecified: Secondary | ICD-10-CM

## 2022-07-18 DIAGNOSIS — K56609 Unspecified intestinal obstruction, unspecified as to partial versus complete obstruction: Secondary | ICD-10-CM | POA: Diagnosis not present

## 2022-07-18 DIAGNOSIS — J690 Pneumonitis due to inhalation of food and vomit: Secondary | ICD-10-CM

## 2022-07-18 DIAGNOSIS — K219 Gastro-esophageal reflux disease without esophagitis: Secondary | ICD-10-CM

## 2022-07-18 DIAGNOSIS — Z96652 Presence of left artificial knee joint: Secondary | ICD-10-CM

## 2022-07-18 DIAGNOSIS — N400 Enlarged prostate without lower urinary tract symptoms: Secondary | ICD-10-CM

## 2022-07-18 LAB — STREP PNEUMONIAE URINARY ANTIGEN: Strep Pneumo Urinary Antigen: NEGATIVE

## 2022-07-18 LAB — COMPREHENSIVE METABOLIC PANEL
ALT: 63 U/L — ABNORMAL HIGH (ref 0–44)
AST: 29 U/L (ref 15–41)
Albumin: 2.9 g/dL — ABNORMAL LOW (ref 3.5–5.0)
Alkaline Phosphatase: 40 U/L (ref 38–126)
Anion gap: 11 (ref 5–15)
BUN: 51 mg/dL — ABNORMAL HIGH (ref 8–23)
CO2: 32 mmol/L (ref 22–32)
Calcium: 8.8 mg/dL — ABNORMAL LOW (ref 8.9–10.3)
Chloride: 100 mmol/L (ref 98–111)
Creatinine, Ser: 1.26 mg/dL — ABNORMAL HIGH (ref 0.61–1.24)
GFR, Estimated: >60 mL/min (ref >60)
Glucose, Bld: 135 mg/dL — ABNORMAL HIGH (ref 70–99)
Potassium: 3.8 mmol/L (ref 3.5–5.1)
Sodium: 143 mmol/L (ref 135–145)
Total Bilirubin: 1.2 mg/dL (ref 0.3–1.2)
Total Protein: 6.1 g/dL — ABNORMAL LOW (ref 6.5–8.1)

## 2022-07-18 LAB — RESP PANEL BY RT-PCR (RSV, FLU A&B, COVID)  RVPGX2
Influenza A by PCR: NEGATIVE
Influenza B by PCR: NEGATIVE
Resp Syncytial Virus by PCR: NEGATIVE
SARS Coronavirus 2 by RT PCR: NEGATIVE

## 2022-07-18 LAB — CBC
HCT: 38 % — ABNORMAL LOW (ref 39.0–52.0)
Hemoglobin: 12.4 g/dL — ABNORMAL LOW (ref 13.0–17.0)
MCH: 30.4 pg (ref 26.0–34.0)
MCHC: 32.6 g/dL (ref 30.0–36.0)
MCV: 93.1 fL (ref 80.0–100.0)
Platelets: 196 10*3/uL (ref 150–400)
RBC: 4.08 MIL/uL — ABNORMAL LOW (ref 4.22–5.81)
RDW: 13.2 % (ref 11.5–15.5)
WBC: 6.6 10*3/uL (ref 4.0–10.5)
nRBC: 0 % (ref 0.0–0.2)

## 2022-07-18 LAB — LACTIC ACID, PLASMA: Lactic Acid, Venous: 1.1 mmol/L (ref 0.5–1.9)

## 2022-07-18 LAB — GLUCOSE, CAPILLARY: Glucose-Capillary: 107 mg/dL — ABNORMAL HIGH (ref 70–99)

## 2022-07-18 MED ORDER — PHENOL 1.4 % MT LIQD
1.0000 | OROMUCOSAL | Status: DC | PRN
  Filled 2022-07-18: qty 177

## 2022-07-18 MED ORDER — DIATRIZOATE MEGLUMINE & SODIUM 66-10 % PO SOLN
90.0000 mL | Freq: Once | ORAL | Status: DC
  Filled 2022-07-18: qty 90

## 2022-07-18 MED ORDER — SODIUM CHLORIDE 0.9 % IV SOLN
3.0000 g | Freq: Four times a day (QID) | INTRAVENOUS | Status: DC
  Administered 2022-07-18 – 2022-07-19 (×5): 3 g via INTRAVENOUS
  Filled 2022-07-18 (×6): qty 8

## 2022-07-18 MED ORDER — BISACODYL 10 MG RE SUPP: 10.0000 mg | Freq: Every day | RECTAL | Status: DC | PRN

## 2022-07-18 MED ORDER — METOCLOPRAMIDE HCL 5 MG/ML IJ SOLN
10.0000 mg | Freq: Four times a day (QID) | INTRAMUSCULAR | Status: DC
  Administered 2022-07-18 (×2): 10 mg via INTRAVENOUS
  Filled 2022-07-18 (×4): qty 2

## 2022-07-18 MED ORDER — PANTOPRAZOLE SODIUM 40 MG IV SOLR
40.0000 mg | Freq: Every day | INTRAVENOUS | Status: DC
  Administered 2022-07-18 (×2): 40 mg via INTRAVENOUS
  Filled 2022-07-18 (×3): qty 10

## 2022-07-18 MED ORDER — TADALAFIL 5 MG PO TABS
5.0000 mg | ORAL_TABLET | Freq: Every day | ORAL | Status: DC
  Filled 2022-07-18 (×3): qty 1

## 2022-07-18 NOTE — Progress Notes (Signed)
PROGRESS NOTE  Brandon Blackwell [id number] DOB: 06/01/1951   PCP: Lilian Coma., MD  Patient is from: Home  DOA: 07/17/2022 LOS: 1  Chief complaints Chief Complaint  Patient presents with   Vomiting     Brief Narrative / Interim history: 71 year old M with PMH of osteoarthritis s/p left TKR 4 days prior to presentation presenting with vomiting, abdominal pain and constipation for 3 days.  Has not had bowel movement since surgery.  He was found to have small bowel obstruction with transition zone in right mid abdomen.  CT also showed diverticulosis without diverticulitis, prostamegaly and fatty liver.  No prior abdominal surgery or SBO.  NG tube placed.  General surgery consulted.    Subjective: Seen and examined earlier this morning.  No major events overnight of this morning.  Reports having "a decent sized" bowel movement this morning.  Denies abdominal pain.  Objective: Vitals:   07/17/22 2357 07/18/22 0419 07/18/22 0812 07/18/22 1211  BP: 132/72 (!) 142/69 (!) 143/89 (!) 142/87  Pulse: 77 72 71 66  Resp: 14 20 18 18   Temp: 97.6 F (36.4 C) 98.1 F (36.7 C) 98.1 F (36.7 C) 98.2 F (36.8 C)  TempSrc: Oral Oral Oral Oral  SpO2: 92% 92% 96% 95%  Weight: 98.1 kg 97.8 kg    Height: 6' (1.829 m)       Examination:  GENERAL: No apparent distress.  Nontoxic. HEENT: MMM.  Vision and hearing grossly intact.  NECK: Supple.  No apparent JVD.  RESP:  No IWOB.  Fair aeration bilaterally. CVS:  RRR. Heart sounds normal.  ABD/GI/GU: BS++.  Abdomen is slightly distended.  No tenderness. MSK/EXT:  Moves extremities.  S/p left TKR. SKIN: no apparent skin lesion or wound NEURO: Awake, alert and oriented appropriately.  No apparent focal neuro deficit. PSYCH: Calm. Normal affect.   Procedures:  NG tube insertion.  Microbiology summarized: [id number], influenza and RSV PCR nonreactive. Blood culture pending.  Assessment and plan: Principal Problem:   SBO (small bowel  obstruction) (HCC) Active Problems:   Status post total left knee replacement   Gastroesophageal reflux disease without esophagitis   BPH (benign prostatic hyperplasia)   AKI (acute kidney injury) ([place])   Aspiration pneumonia (HCC)  Small bowel obstruction: Presents with 3 days of vomiting, abdominal pain and no bowel movements.  NG tube inserted.  General surgery consulted.  Patient reports a decent sized bowel movement this morning. -General surgery following-started CLD and clamped NG tube. -Continue IVF, antiemetics -Mobilize patient.   AKI: Likely prerenal from GI loss.  Improving. Recent Labs    07/06/22 0816 07/14/22 0417 07/17/22 0534 07/17/22 2131 07/18/22 0048  BUN 27* 33* 49*  --  51*  CREATININE 1.21 1.16 1.47* 1.31* 1.26*  -Continue IV -Avoid nephrotoxic meds -Monitor urine output   Possible aspiration pneumonia: CT abdomen and pelvis showed bibasilar infiltrate and atelectasis.  Patient has no respiratory symptoms but at risk for aspiration.  Mild fever to 100.5 last night.  No leukocytosis.  Started on IV Zosyn. -De-escalate to IV Unasyn -Follow blood cultures. -Incentive spirometry     S/p left TKR about 4 days prior to presentation.  No issues. -PT/OT -supportive care   BPH: Seems to be on Cialis at home. -Resume home Cialis.  Body mass index is 29.24 kg/m.          DVT prophylaxis:  heparin injection 5,000 Units Start: 07/17/22 2200  Code Status: Full code Family Communication: None at bedside Level of care:  Telemetry Status is: Inpatient Remains inpatient appropriate because: Small bowel obstruction, AKI and possible aspiration pneumonia   Final disposition: Likely home once medically stable Consultants:  General surgery  Sch Meds:  Scheduled Meds:  heparin  5,000 Units Subcutaneous Q8H   pantoprazole (PROTONIX) IV  40 mg Intravenous QHS   tadalafil  5 mg Oral Daily   Continuous Infusions:  sodium chloride 125 mL/hr at 07/18/22  0501   sodium chloride Stopped (07/18/22 0221)   acetaminophen 1,000 mg (07/18/22 1205)   ampicillin-sulbactam (UNASYN) IV     lactated ringers 125 mL/hr at 07/17/22 2300   PRN Meds:.sodium chloride, acetaminophen, albuterol, artificial tears, bisacodyl, ondansetron **OR** ondansetron (ZOFRAN) IV, phenol  Antimicrobials: Anti-infectives (From admission, onward)    Start     Dose/Rate Route Frequency Ordered Stop   07/18/22 1330  Ampicillin-Sulbactam (UNASYN) 3 g in sodium chloride 0.9 % 100 mL IVPB        3 g 200 mL/hr over 30 Minutes Intravenous Every 6 hours 07/18/22 1252     07/17/22 2200  piperacillin-tazobactam (ZOSYN) IVPB 3.375 g  Status:  Discontinued        3.375 g 12.5 mL/hr over 240 Minutes Intravenous Every 8 hours 07/17/22 2128 07/18/22 1252        I have personally reviewed the following labs and images: CBC: Recent Labs  Lab 07/14/22 0417 07/17/22 0534 07/17/22 2131 07/18/22 0048  WBC 12.7* 9.1 6.6 6.6  NEUTROABS  --  7.3  --   --   HGB 14.1 13.7 12.5* 12.4*  HCT 42.4 41.8 38.9* 38.0*  MCV 92.2 90.9 93.7 93.1  PLT 157 215 208 196   BMP &GFR Recent Labs  Lab 07/14/22 0417 07/17/22 0534 07/17/22 2131 07/18/22 0048  NA 142 141  --  143  K 4.4 4.0  --  3.8  CL 111 101  --  100  CO2 23 28  --  32  GLUCOSE 184* 150*  --  135*  BUN 33* 49*  --  51*  CREATININE 1.16 1.47* 1.31* 1.26*  CALCIUM 8.7* 9.2  --  8.8*  MG  --  2.3  --   --   PHOS  --  3.4  --   --    Estimated Creatinine Clearance: 63.3 mL/min (A) (by C-G formula based on SCr of 1.26 mg/dL (H)). Liver & Pancreas: Recent Labs  Lab 07/17/22 0809 07/18/22 0048  AST 38 29  ALT 74* 63*  ALKPHOS 53 40  BILITOT 1.8* 1.2  PROT 7.0 6.1*  ALBUMIN 3.5 2.9*   Recent Labs  Lab 07/17/22 0809  LIPASE 26   No results for input(s): "AMMONIA" in the last 168 hours. Diabetic: No results for input(s): "HGBA1C" in the last 72 hours. No results for input(s): "GLUCAP" in the last 168  hours. Cardiac Enzymes: No results for input(s): "CKTOTAL", "CKMB", "CKMBINDEX", "TROPONINI" in the last 168 hours. No results for input(s): "PROBNP" in the last 8760 hours. Coagulation Profile: No results for input(s): "INR", "PROTIME" in the last 168 hours. Thyroid Function Tests: Recent Labs    07/17/22 2131  TSH 1.021   Lipid Profile: No results for input(s): "CHOL", "HDL", "LDLCALC", "TRIG", "CHOLHDL", "LDLDIRECT" in the last 72 hours. Anemia Panel: No results for input(s): "VITAMINB12", "FOLATE", "FERRITIN", "TIBC", "IRON", "RETICCTPCT" in the last 72 hours. Urine analysis:    Component Value Date/Time   COLORURINE YELLOW 07/17/2022 1330   APPEARANCEUR CLEAR 07/17/2022 1330   LABSPEC 1.010 07/17/2022 1330   PHURINE  5.0 07/17/2022 1330   GLUCOSEU NEGATIVE 07/17/2022 1330   HGBUR NEGATIVE 07/17/2022 1330   BILIRUBINUR NEGATIVE 07/17/2022 1330   KETONESUR NEGATIVE 07/17/2022 1330   PROTEINUR 100 (A) 07/17/2022 1330   NITRITE NEGATIVE 07/17/2022 1330   LEUKOCYTESUR NEGATIVE 07/17/2022 1330   Sepsis Labs: Invalid input(s): "PROCALCITONIN", "LACTICIDVEN"  Microbiology: Recent Results (from the past 240 hour(s))  Resp panel by RT-PCR (RSV, Flu A&B, Covid) Anterior Nasal Swab     Status: None   Collection Time: 07/17/22 11:34 PM   Specimen: Anterior Nasal Swab  Result Value Ref Range Status   SARS Coronavirus 2 by RT PCR NEGATIVE NEGATIVE Final    Comment: (NOTE) SARS-CoV-2 target nucleic acids are NOT DETECTED.  The SARS-CoV-2 RNA is generally detectable in upper respiratory specimens during the acute phase of infection. The lowest concentration of SARS-CoV-2 viral copies this assay can detect is 138 copies/mL. A negative result does not preclude SARS-Cov-2 infection and should not be used as the sole basis for treatment or other patient management decisions. A negative result may occur with  improper specimen collection/handling, submission of specimen other than  nasopharyngeal swab, presence of viral mutation(s) within the areas targeted by this assay, and inadequate number of viral copies(<138 copies/mL). A negative result must be combined with clinical observations, patient history, and epidemiological information. The expected result is Negative.  Fact Sheet for Patients:  BloggerCourse.com  Fact Sheet for Healthcare Providers:  SeriousBroker.it  This test is no t yet approved or cleared by the [place] FDA and  has been authorized for detection and/or diagnosis of SARS-CoV-2 by FDA under an Emergency Use Authorization (EUA). This EUA will remain  in effect (meaning this test can be used) for the duration of the COVID-19 declaration under Section 564(b)(1) of the Act, 21 U.S.C.section 360bbb-3(b)(1), unless the authorization is terminated  or revoked sooner.       Influenza A by PCR NEGATIVE NEGATIVE Final   Influenza B by PCR NEGATIVE NEGATIVE Final    Comment: (NOTE) The Xpert Xpress SARS-CoV-2/FLU/RSV plus assay is intended as an aid in the diagnosis of influenza from Nasopharyngeal swab specimens and should not be used as a sole basis for treatment. Nasal washings and aspirates are unacceptable for Xpert Xpress SARS-CoV-2/FLU/RSV testing.  Fact Sheet for Patients: BloggerCourse.com  Fact Sheet for Healthcare Providers: SeriousBroker.it  This test is not yet approved or cleared by the [place] FDA and has been authorized for detection and/or diagnosis of SARS-CoV-2 by FDA under an Emergency Use Authorization (EUA). This EUA will remain in effect (meaning this test can be used) for the duration of the COVID-19 declaration under Section 564(b)(1) of the Act, 21 U.S.C. section 360bbb-3(b)(1), unless the authorization is terminated or revoked.     Resp Syncytial Virus by PCR NEGATIVE NEGATIVE Final    Comment:  (NOTE) Fact Sheet for Patients: BloggerCourse.com  Fact Sheet for Healthcare Providers: SeriousBroker.it  This test is not yet approved or cleared by the [place] FDA and has been authorized for detection and/or diagnosis of SARS-CoV-2 by FDA under an Emergency Use Authorization (EUA). This EUA will remain in effect (meaning this test can be used) for the duration of the COVID-19 declaration under Section 564(b)(1) of the Act, 21 U.S.C. section 360bbb-3(b)(1), unless the authorization is terminated or revoked.  Performed at [facility], [address] [place], [place] [address]     Radiology Studies: DG Abd 1 View  Result Date: 07/18/2022 CLINICAL DATA:  [phone]  Encounter for nasogastric (NG) tube placement AP:[id number] EXAM: ABDOMEN - 1 VIEW COMPARISON:  CT abdomen pelvis 07/17/2022. FINDINGS: Enteric tube with tip and side port overlying the expected region the gastric lumen. Gaseous dilatation of several loops of small bowel noted. No radio-opaque calculi or other significant radiographic abnormality are seen. IMPRESSION: 1. Enteric tube with tip and side port overlying the expected region the gastric lumen. 2. Small-bowel obstruction. Electronically Signed   By: Iven Finn M.D.   On: 07/18/2022 01:24      Maelyn Berrey T. Crozier  If 7PM-7AM, please contact night-coverage [email or web address] 07/18/2022, 12:54 PM

## 2022-07-18 NOTE — Progress Notes (Signed)
NG tube placed left nare, verified by xray, patent connected to low intermittent suction has thick green secretions. Pt states he feels much better.

## 2022-07-19 ENCOUNTER — Telehealth: Payer: Self-pay | Admitting: Orthopedic Surgery

## 2022-07-19 DIAGNOSIS — N179 Acute kidney failure, unspecified: Secondary | ICD-10-CM | POA: Diagnosis not present

## 2022-07-19 DIAGNOSIS — N1831 Chronic kidney disease, stage 3a: Secondary | ICD-10-CM

## 2022-07-19 DIAGNOSIS — K56609 Unspecified intestinal obstruction, unspecified as to partial versus complete obstruction: Secondary | ICD-10-CM | POA: Diagnosis not present

## 2022-07-19 DIAGNOSIS — N400 Enlarged prostate without lower urinary tract symptoms: Secondary | ICD-10-CM | POA: Diagnosis not present

## 2022-07-19 DIAGNOSIS — J690 Pneumonitis due to inhalation of food and vomit: Secondary | ICD-10-CM | POA: Diagnosis not present

## 2022-07-19 LAB — RENAL FUNCTION PANEL
Albumin: 2.9 g/dL — ABNORMAL LOW (ref 3.5–5.0)
Anion gap: 8 (ref 5–15)
BUN: 37 mg/dL — ABNORMAL HIGH (ref 8–23)
CO2: 29 mmol/L (ref 22–32)
Calcium: 8.6 mg/dL — ABNORMAL LOW (ref 8.9–10.3)
Chloride: 109 mmol/L (ref 98–111)
Creatinine, Ser: 1.24 mg/dL (ref 0.61–1.24)
GFR, Estimated: >60 mL/min (ref >60)
Glucose, Bld: 102 mg/dL — ABNORMAL HIGH (ref 70–99)
Phosphorus: 3.4 mg/dL (ref 2.5–4.6)
Potassium: 3.7 mmol/L (ref 3.5–5.1)
Sodium: 146 mmol/L — ABNORMAL HIGH (ref 135–145)

## 2022-07-19 LAB — HEMOGLOBIN A1C
Hgb A1c MFr Bld: 5.4 % (ref 4.8–5.6)
Mean Plasma Glucose: 108 mg/dL

## 2022-07-19 LAB — CBC
HCT: 37.9 % — ABNORMAL LOW (ref 39.0–52.0)
Hemoglobin: 12.2 g/dL — ABNORMAL LOW (ref 13.0–17.0)
MCH: 30.7 pg (ref 26.0–34.0)
MCHC: 32.2 g/dL (ref 30.0–36.0)
MCV: 95.5 fL (ref 80.0–100.0)
Platelets: 191 10*3/uL (ref 150–400)
RBC: 3.97 MIL/uL — ABNORMAL LOW (ref 4.22–5.81)
RDW: 13.2 % (ref 11.5–15.5)
WBC: 4.9 10*3/uL (ref 4.0–10.5)
nRBC: 0 % (ref 0.0–0.2)

## 2022-07-19 LAB — LEGIONELLA PNEUMOPHILA SEROGP 1 UR AG: L. pneumophila Serogp 1 Ur Ag: NEGATIVE

## 2022-07-19 LAB — MAGNESIUM: Magnesium: 2.4 mg/dL (ref 1.7–2.4)

## 2022-07-19 MED ORDER — ACETAMINOPHEN 325 MG PO TABS
650.0000 mg | ORAL_TABLET | Freq: Four times a day (QID) | ORAL | Status: DC | PRN
  Administered 2022-07-19 – 2022-07-20 (×4): 650 mg via ORAL
  Filled 2022-07-19 (×5): qty 2

## 2022-07-19 MED ORDER — MORPHINE SULFATE (PF) 2 MG/ML IV SOLN: 0.5000 mg | INTRAVENOUS | Status: DC | PRN

## 2022-07-19 MED ORDER — ACETAMINOPHEN 325 MG PO TABS: 650.0000 mg | ORAL_TABLET | Freq: Four times a day (QID) | ORAL | Status: AC | PRN

## 2022-07-19 MED ORDER — AMOXICILLIN-POT CLAVULANATE 875-125 MG PO TABS: 1.0000 | ORAL_TABLET | Freq: Two times a day (BID) | ORAL | 0 refills | Status: AC

## 2022-07-19 MED ORDER — SODIUM CHLORIDE 0.45 % IV SOLN: INTRAVENOUS | Status: DC

## 2022-07-19 MED ORDER — MORPHINE SULFATE (PF) 2 MG/ML IV SOLN
1.0000 mg | Freq: Once | INTRAVENOUS | Status: DC
  Filled 2022-07-19: qty 1

## 2022-07-19 MED ORDER — POLYETHYLENE GLYCOL 3350 17 GM/SCOOP PO POWD: 17.0000 g | Freq: Two times a day (BID) | ORAL | 1 refills | Status: AC | PRN

## 2022-07-19 NOTE — Progress Notes (Signed)
Progress Note     Subjective: Patient is feeling much better today Has had two moderate-sized bowel movements overnight   Objective: Vital signs in last 24 hours: Temp:  [97.8 F (36.6 C)-98.2 F (36.8 C)] 97.8 F (36.6 C) (12/21 0549) Pulse Rate:  [65-75] 65 (12/21 0549) Resp:  [18] 18 (12/20 2235) BP: (142-167)/(79-92) 167/92 (12/21 0549) SpO2:  [92 %-95 %] 95 % (12/21 0549) Weight:  [97.3 kg] 97.3 kg (12/21 0549) Last BM Date : 07/18/22  Intake/Output from previous day: 12/20 0701 - 12/21 0700 In: 2418.4 [I.V.:2066.5; IV Piggyback:351.9] Out: 1875 [Urine:1275; Emesis/NG output:600] Intake/Output this shift: No intake/output data recorded.  PE:  WDWN in NAD Abd - less distended; non-tender;    Lab Results:  Recent Labs    07/18/22 0048 07/19/22 0408  WBC 6.6 4.9  HGB 12.4* 12.2*  HCT 38.0* 37.9*  PLT 196 191   BMET Recent Labs    07/18/22 0048 07/19/22 0408  NA 143 146*  K 3.8 3.7  CL 100 109  CO2 32 29  GLUCOSE 135* 102*  BUN 51* 37*  CREATININE 1.26* 1.24  CALCIUM 8.8* 8.6*   PT/INR No results for input(s): "LABPROT", "INR" in the last 72 hours. CMP     Component Value Date/Time   NA 146 (H) 07/19/2022 0408   K 3.7 07/19/2022 0408   CL 109 07/19/2022 0408   CO2 29 07/19/2022 0408   GLUCOSE 102 (H) 07/19/2022 0408   BUN 37 (H) 07/19/2022 0408   CREATININE 1.24 07/19/2022 0408   CALCIUM 8.6 (L) 07/19/2022 0408   PROT 6.1 (L) 07/18/2022 0048   ALBUMIN 2.9 (L) 07/19/2022 0408   AST 29 07/18/2022 0048   ALT 63 (H) 07/18/2022 0048   ALKPHOS 40 07/18/2022 0048   BILITOT 1.2 07/18/2022 0048   GFRNONAA >60 07/19/2022 0408   Lipase     Component Value Date/Time   LIPASE 26 07/17/2022 0809       Studies/Results: DG Abd Portable 2V  Result Date: 07/18/2022 CLINICAL DATA:  Postoperative ileus EXAM: PORTABLE ABDOMEN - 2 VIEW COMPARISON:  Portable exam 1429 hours compared to 0056 hours; correlation CT abdomen and pelvis 07/17/2022  FINDINGS: Nasogastric tube projects over proximal stomach. Persistent gaseous distension of small bowel loops throughout upper and mid abdomen. Paucity of colonic gas and stool. No definite bowel wall thickening or free air. Mild bibasilar atelectasis. Excreted contrast material within urinary bladder. Osseous structures unremarkable. IMPRESSION: Dilated small bowel loops with paucity of colonic gas and stool; the pattern may reflect postoperative ileus or bowel obstruction but prior CT exam was most consistent with mid to distal small bowel obstruction. Electronically Signed   By: Ulyses Southward M.D.   On: 07/18/2022 14:54   DG Abd 1 View  Result Date: 07/18/2022 CLINICAL DATA:  [id number] Encounter for nasogastric (NG) tube placement [id number] EXAM: ABDOMEN - 1 VIEW COMPARISON:  CT abdomen pelvis 07/17/2022. FINDINGS: Enteric tube with tip and side port overlying the expected region the gastric lumen. Gaseous dilatation of several loops of small bowel noted. No radio-opaque calculi or other significant radiographic abnormality are seen. IMPRESSION: 1. Enteric tube with tip and side port overlying the expected region the gastric lumen. 2. Small-bowel obstruction. Electronically Signed   By: Tish Frederickson M.D.   On: 07/18/2022 01:24    Anti-infectives: Anti-infectives (From admission, onward)    Start     Dose/Rate Route Frequency Ordered Stop   07/18/22 1330  Ampicillin-Sulbactam (UNASYN) 3 g in  sodium chloride 0.9 % 100 mL IVPB        3 g 200 mL/hr over 30 Minutes Intravenous Every 6 hours 07/18/22 1252     07/17/22 2200  piperacillin-tazobactam (ZOSYN) IVPB 3.375 g  Status:  Discontinued        3.375 g 12.5 mL/hr over 240 Minutes Intravenous Every 8 hours 07/17/22 2128 07/18/22 1252        Assessment/Plan Post-operative ileus - resolving Remove NG tube Begin clear liquids, advance as tolerated FEN - Clear liquids, advance as tolerated; IVF per [facility] VTE - SCDs, heparin subcutaneous ID - unasyn -  PNA Foley - None  Pulmonary issues per [facility]  Patient may be ready for discharge later today or tomorrow morning, based on how he tolerates a diet.  No indications for surgery.  LOS: 2 days   Eric Form, [facility] Surgery 07/19/2022, 9:00 AM   Wilmon Arms. Corliss Skains, MD, [facility] Surgery  General Surgery   07/19/2022 9:22 AM

## 2022-07-19 NOTE — Evaluation (Addendum)
Physical Therapy Evaluation Patient Details Name: Brandon Blackwell MRN: [id number] DOB: 11/29/1950 Today's Date: 07/19/2022  History of Present Illness  71 y.o. male with only significant medical history significant of  arthritis s/p L TKR on 07/13/22 , who presents to ED with progressive vomiting and abdominal pain x the last 3 days. He notes since his surgery he had not has bowel movement. Dx of SBO.  Clinical Impression  Pt admitted with above diagnosis. Pt ambulated 350' with RW, no loss of balance. Performed TKA HEP with supervision. Pt is mobilizing well enough to ambulate independently in the halls.  Pt currently with functional limitations due to the deficits listed below (see PT Problem List). Pt will benefit from skilled PT to increase their independence and safety with mobility to allow discharge to the venue listed below.          Recommendations for follow up therapy are one component of a multi-disciplinary discharge planning process, led by the attending physician.  Recommendations may be updated based on patient status, additional functional criteria and insurance authorization.  Follow Up Recommendations Home health PT (pt was receiving HHPT prior to this admission)      Assistance Recommended at Discharge Set up Supervision/Assistance  Patient can return home with the following  Assist for transportation;Assistance with cooking/housework;A little help with bathing/dressing/bathroom    Equipment Recommendations None recommended by PT  Recommendations for Other Services       Functional Status Assessment Patient has had a recent decline in their functional status and demonstrates the ability to make significant improvements in function in a reasonable and predictable amount of time.     Precautions / Restrictions Precautions Precautions: Fall;Knee Precaution Booklet Issued: yes Precaution Comments: no pillow under the knee Restrictions Weight Bearing Restrictions:  No Other Position/Activity Restrictions: wbat      Mobility  Bed Mobility Overal bed mobility: Modified Independent Bed Mobility: Supine to Sit     Supine to sit: Modified independent (Device/Increase time), HOB elevated          Transfers Overall transfer level: Modified independent Equipment used: Rolling walker (2 wheels) Transfers: Sit to/from Stand Sit to Stand: Modified independent (Device/Increase time)                Ambulation/Gait Ambulation/Gait assistance: Modified independent (Device/Increase time) Gait Distance (Feet): 350 Feet Assistive device: Rolling walker (2 wheels) Gait Pattern/deviations: WFL(Within Functional Limits), Step-through pattern Gait velocity: WNL     General Gait Details: steady, no loss of balance  Stairs            Wheelchair Mobility    Modified Rankin (Stroke Patients Only)       Balance Overall balance assessment: Needs assistance Sitting-balance support: Feet supported, No upper extremity supported Sitting balance-Leahy Scale: Good       Standing balance-Leahy Scale: Good                               Pertinent Vitals/Pain Pain Assessment Pain Score: 2  Pain Location: left knee Pain Descriptors / Indicators: Operative site guarding Pain Intervention(s): Limited activity within patient's tolerance, Monitored during session, Patient requesting pain meds-RN notified    Home Living Family/patient expects to be discharged to:: Private residence Living Arrangements: Spouse/significant other Available Help at Discharge: Family;Available 24 hours/day Type of Home: House Home Access: Stairs to enter Entrance Stairs-Rails: Right;Left;Can reach both Entrance Stairs-Number of Steps: 3   Home Layout: One level Home  Equipment: Shower seat - built in;Cane - single Librarian, academic (2 wheels)      Prior Function Prior Level of Function : Independent/Modified Independent;Driving;History of Falls  (last six months)             Mobility Comments: IND ADLs Comments: IND     Hand Dominance        Extremity/Trunk Assessment        Lower Extremity Assessment Lower Extremity Assessment: LLE deficits/detail RLE Sensation: WNL LLE Deficits / Details: AAROM L knee ~0-70* LLE Sensation: WNL    Cervical / Trunk Assessment Cervical / Trunk Assessment: Normal  Communication   Communication: No difficulties  Cognition Arousal/Alertness: Awake/alert Behavior During Therapy: WFL for tasks assessed/performed Overall Cognitive Status: Within Functional Limits for tasks assessed                                          General Comments      Exercises Total Joint Exercises Ankle Circles/Pumps: AROM, Both, 20 reps Quad Sets: AROM, Left, 10 reps Towel Squeeze: AROM, Both, 10 reps Short Arc Quad: AROM, Left, 10 reps, Supine Heel Slides: AROM, Left, 10 reps, Supine Hip ABduction/ADduction: AROM, Left, 10 reps, Supine Straight Leg Raises: Left, 10 reps, AROM, Supine Long Arc Quad: AROM, Left, 10 reps, Seated Knee Flexion: AAROM, Left, 10 reps, Seated Goniometric ROM: 0-70* AAROM   Assessment/Plan    PT Assessment Patient needs continued PT services  PT Problem List Decreased strength;Decreased range of motion;Decreased activity tolerance;Decreased balance;Decreased mobility;Decreased coordination;Pain       PT Treatment Interventions DME instruction;Gait training;Stair training;Functional mobility training;Therapeutic activities;Therapeutic exercise;Balance training;Neuromuscular re-education;Patient/family education    PT Goals (Current goals can be found in the Care Plan section)  Acute Rehab PT Goals Patient Stated Goal: Gym, golf PT Goal Formulation: With patient/family Time For Goal Achievement: 07/26/22 Potential to Achieve Goals: Good    Frequency 7X/week     Co-evaluation               AM-PAC PT "6 Clicks" Mobility  Outcome  Measure Help needed turning from your back to your side while in a flat bed without using bedrails?: None Help needed moving from lying on your back to sitting on the side of a flat bed without using bedrails?: None Help needed moving to and from a bed to a chair (including a wheelchair)?: None Help needed standing up from a chair using your arms (e.g., wheelchair or bedside chair)?: None Help needed to walk in hospital room?: None Help needed climbing 3-5 steps with a railing? : None 6 Click Score: 24    End of Session Equipment Utilized During Treatment: Gait belt Activity Tolerance: No increased pain;Patient tolerated treatment well Patient left: in chair;with call bell/phone within reach;with family/visitor present Nurse Communication: Mobility status PT Visit Diagnosis: Pain Pain - Right/Left: Left Pain - part of body: Knee    Time: 1136-1205 PT Time Calculation (min) (ACUTE ONLY): 29 min   Charges:     PT Treatments $Gait Training: 8-22 mins $Therapeutic Exercise: 8-22 mins        Ralene Bathe Kistler PT 07/19/2022  Acute Rehabilitation Services  Office [phone]

## 2022-07-19 NOTE — Plan of Care (Signed)
  Problem: Education: Goal: Knowledge of the prescribed therapeutic regimen will improve Outcome: Completed/Met Goal: Individualized Educational Video(s) Outcome: Completed/Met   Problem: Activity: Goal: Ability to avoid complications of mobility impairment will improve Outcome: Completed/Met Goal: Range of joint motion will improve Outcome: Completed/Met   Problem: Clinical Measurements: Goal: Postoperative complications will be avoided or minimized Outcome: Completed/Met   Problem: Pain Management: Goal: Pain level will decrease with appropriate interventions Outcome: Completed/Met   Problem: Skin Integrity: Goal: Will show signs of wound healing Outcome: Completed/Met   Problem: Education: Goal: Knowledge of General Education information will improve Description: Including pain rating scale, medication(s)/side effects and non-pharmacologic comfort measures Outcome: Completed/Met   Problem: Health Behavior/Discharge Planning: Goal: Ability to manage health-related needs will improve Outcome: Completed/Met   Problem: Clinical Measurements: Goal: Ability to maintain clinical measurements within normal limits will improve Outcome: Completed/Met Goal: Will remain free from infection Outcome: Completed/Met Goal: Diagnostic test results will improve Outcome: Completed/Met Goal: Respiratory complications will improve Outcome: Completed/Met Goal: Cardiovascular complication will be avoided Outcome: Completed/Met   Problem: Activity: Goal: Risk for activity intolerance will decrease Outcome: Completed/Met   Problem: Nutrition: Goal: Adequate nutrition will be maintained Outcome: Completed/Met   Problem: Coping: Goal: Level of anxiety will decrease Outcome: Completed/Met   Problem: Elimination: Goal: Will not experience complications related to bowel motility Outcome: Completed/Met Goal: Will not experience complications related to urinary retention Outcome:  Completed/Met   Problem: Pain Managment: Goal: General experience of comfort will improve Outcome: Completed/Met   Problem: Safety: Goal: Ability to remain free from injury will improve Outcome: Completed/Met   Problem: Skin Integrity: Goal: Risk for impaired skin integrity will decrease Outcome: Completed/Met   Problem: Activity: Goal: Ability to tolerate increased activity will improve Outcome: Completed/Met   Problem: Clinical Measurements: Goal: Ability to maintain a body temperature in the normal range will improve Outcome: Completed/Met   Problem: Respiratory: Goal: Ability to maintain adequate ventilation will improve Outcome: Completed/Met Goal: Ability to maintain a clear airway will improve Outcome: Completed/Met

## 2022-07-19 NOTE — Telephone Encounter (Signed)
Brandon Blackwell called regarding her husband, Brandon Blackwell.  He had left total knee replacement with Dr. Magnus Ivan on 07/13/22.  He ultimately ended up back in the hospital with a bowel obstruction" possibly from anesthesia per Brandon Blackwell.  She wanted to make sure that Dr. Magnus Ivan was aware that he was still in the hospital and recovering and has not had much in the way of formal physical therapy.  I advised that Dr. Magnus Ivan would be able to view the hospital notes in [facility] and if she needed anything from our office to please let us know.

## 2022-07-19 NOTE — Discharge Summary (Signed)
Physician Discharge Summary  Brandon Blackwell WUJ:[id number] DOB: 1951-06-16 DOA: 07/17/2022  PCP: Malka So., MD  Admit date: 07/17/2022 Discharge date: 07/19/2022 Admitted From: Home. Disposition: Home. Recommendations for Outpatient Follow-up:  Follow up with PCP in 1 to 2 weeks or sooner if needed Outpatient follow-up with orthopedic surgery as previously planned Check CBC and CMP in 1 to 2 weeks Please follow up on the following pending results: None  Home Health: Not indicated Equipment/Devices: Not indicated  Discharge Condition: Stable CODE STATUS: Full code  Follow-up Information     Malka So., MD. Schedule an appointment as soon as possible for a visit in 1 week(s).   Specialty: Internal Medicine Contact information: [address] Dr[address] Laurell Josephs [address] [place] [address] [phone]                 Hospital course 71 year old M with PMH of osteoarthritis s/p left TKR 4 days prior to presentation presenting with vomiting, abdominal pain and constipation for 3 days.  Has not had bowel movement since surgery.  He was found to have small bowel obstruction with transition zone in right mid abdomen.  CT also showed diverticulosis without diverticulitis, prostamegaly and fatty liver.  No prior abdominal surgery or SBO.  NG tube placed.  General surgery consulted.   Patient continued to have bowel movements.  Eventually, nausea and vomiting resolved as well.  He tolerated soft diet and cleared for discharge by general surgery.  He is discharged on p.o. MiraLAX.  Advised to avoid or minimize opiates.  AKI resolved.  In regards to possible aspiration pneumonia, he did not have significant respiratory symptoms.  He is discharged on p.o. Augmentin for 2 more days to complete a total of 5 days course.  See individual problem list below for more.   Problems addressed during this hospitalization Principal Problem:   SBO (small bowel obstruction) (HCC) Active  Problems:   Status post total left knee replacement   Gastroesophageal reflux disease without esophagitis   BPH (benign prostatic hyperplasia)   AKI (acute kidney injury) (HCC)   Aspiration pneumonia (HCC)              Vital signs Vitals:   07/18/22 1211 07/18/22 2235 07/19/22 0549 07/19/22 1414  BP: (!) 142/87 (!) 160/79 (!) 167/92 (!) 154/77  Pulse: 66 75 65 75  Temp: 98.2 F (36.8 C) 98.1 F (36.7 C) 97.8 F (36.6 C) 98.2 F (36.8 C)  Resp: Height:      Weight:   97.3 kg   SpO2: 95% 92% 95% 96%  TempSrc: Oral Oral Oral Oral  BMI (Calculated):   29.09      Discharge exam  GENERAL: No apparent distress.  Nontoxic. HEENT: MMM.  Vision and hearing grossly intact.  NECK: Supple.  No apparent JVD.  RESP:  No IWOB.  Fair aeration bilaterally. CVS:  RRR. Heart sounds normal.  ABD/GI/GU: BS+. Abd soft, NTND.  MSK/EXT:  Moves extremities.  Dressing over left knee DCI. SKIN: no apparent skin lesion or wound NEURO: Awake and alert. Oriented appropriately.  No apparent focal neuro deficit. PSYCH: Calm. Normal affect.   Discharge Instructions Discharge Instructions     Call MD for:  extreme fatigue   Complete by: As directed    Call MD for:  persistant nausea and vomiting   Complete by: As directed    Call MD for:  severe uncontrolled pain   Complete by: As directed  Diet general   Complete by: As directed    Discharge instructions   Complete by: As directed    It has been a pleasure taking care of you!  You were hospitalized for possible bowel obstruction/ileus.  We suspect this is related to pain medication after your recent surgery.  Your symptoms resolved.  You may continue using MiraLAX as needed for constipation.  Follow-up with your primary care doctor in 1 to 2 weeks or sooner if needed.   Take care,   Increase activity slowly   Complete by: As directed       Allergies as of 07/19/2022   No Known Allergies      Medication List      STOP taking these medications    methocarbamol 500 MG tablet Commonly known as: ROBAXIN   oxyCODONE 5 MG immediate release tablet Commonly known as: Oxy IR/ROXICODONE       TAKE these medications    acetaminophen 325 MG tablet Commonly known as: TYLENOL Take 2 tablets (650 mg total) by mouth every 6 (six) hours as needed for moderate pain or mild pain. What changed: reasons to take this   amoxicillin-clavulanate 875-125 MG tablet Commonly known as: AUGMENTIN Take 1 tablet by mouth 2 (two) times daily for 2 days.   ARTIFICIAL TEAR SOLUTION OP Place 1 drop into both eyes daily as needed (dry eyes).   aspirin 81 MG chewable tablet Chew 1 tablet (81 mg total) by mouth 2 (two) times daily.   multivitamin with minerals Tabs tablet Take 1 tablet by mouth daily.   ondansetron 4 MG tablet Commonly known as: Zofran Take 1 tablet (4 mg total) by mouth every 8 (eight) hours as needed for nausea or vomiting.   polyethylene glycol powder 17 GM/SCOOP powder Commonly known as: MiraLax Take 17 g by mouth 2 (two) times daily as needed for mild constipation.   sildenafil 20 MG tablet Commonly known as: REVATIO Take 40-100 mg by mouth daily as needed (ED).   tadalafil 5 MG tablet Commonly known as: CIALIS Take 5 mg by mouth daily.        Consultations: General surgery  Procedures/Studies:   DG Abd Portable 2V  Result Date: 07/18/2022 CLINICAL DATA:  Postoperative ileus EXAM: PORTABLE ABDOMEN - 2 VIEW COMPARISON:  Portable exam 1429 hours compared to 0056 hours; correlation CT abdomen and pelvis 07/17/2022 FINDINGS: Nasogastric tube projects over proximal stomach. Persistent gaseous distension of small bowel loops throughout upper and mid abdomen. Paucity of colonic gas and stool. No definite bowel wall thickening or free air. Mild bibasilar atelectasis. Excreted contrast material within urinary bladder. Osseous structures unremarkable. IMPRESSION: Dilated small bowel loops  with paucity of colonic gas and stool; the pattern may reflect postoperative ileus or bowel obstruction but prior CT exam was most consistent with mid to distal small bowel obstruction. Electronically Signed   By: Ulyses Southward M.D.   On: 07/18/2022 14:54   DG Abd 1 View  Result Date: 07/18/2022 CLINICAL DATA:  [id number] Encounter for nasogastric (NG) tube placement [id number] EXAM: ABDOMEN - 1 VIEW COMPARISON:  CT abdomen pelvis 07/17/2022. FINDINGS: Enteric tube with tip and side port overlying the expected region the gastric lumen. Gaseous dilatation of several loops of small bowel noted. No radio-opaque calculi or other significant radiographic abnormality are seen. IMPRESSION: 1. Enteric tube with tip and side port overlying the expected region the gastric lumen. 2. Small-bowel obstruction. Electronically Signed   By: Tish Frederickson M.D.   On: 07/18/2022  01:24   CT ABDOMEN PELVIS W CONTRAST  Result Date: 07/17/2022 CLINICAL DATA:  Vomiting Saturday night, suspected bowel obstruction, recent LEFT total knee arthroplasty EXAM: CT ABDOMEN AND PELVIS WITH CONTRAST TECHNIQUE: Multidetector CT imaging of the abdomen and pelvis was performed using the standard protocol following bolus administration of intravenous contrast. RADIATION DOSE REDUCTION: This exam was performed according to the departmental dose-optimization program which includes automated exposure control, adjustment of the mA and/or kV according to patient size and/or use of iterative reconstruction technique. CONTRAST:  100mL OMNIPAQUE IOHEXOL 300 MG/ML SOLN IV. No oral contrast. COMPARISON:  None Available. FINDINGS: Lower chest: Infiltrates and atelectasis in BILATERAL lower lobes Hepatobiliary: Gallbladder unremarkable. Fatty infiltration of liver. No biliary dilatation. Pancreas: Normal appearance Spleen: Normal appearance Adrenals/Urinary Tract: Adrenal glands normal appearance. BILATERAL simple appearing renal cysts up to 3.9 x 3.0 cm RIGHT and  2.0 x 1.8 cm LEFT; no follow-up imaging recommended. No urinary tract calcification or dilatation. Ureters and bladder unremarkable. Stomach/Bowel: Distended stomach and proximal small bowel loops with decompressed distal small bowel loops consistent with small bowel obstruction. Transition from dilated to nondilated small bowel occurs in RIGHT mid abdomen. No obvious mass or wall thickening at transition site. Diverticulosis of descending and sigmoid colon without evidence of diverticulitis. Scattered diverticula proximal colon. Normal appendix. Vascular/Lymphatic: Atherosclerotic calcifications aorta. No adenopathy. Reproductive: Enlarged prostate gland 5.1 x 4.3 x 6.6 cm, including significant enlargement of the central lobe elevating and indenting bladder base. Nonspecific focus LEFT of midline 13 x 11 x 13 mm. Seminal vesicles unremarkable. Other: No free air or free fluid.  No hernia. Musculoskeletal: Degenerative disc disease changes lumbar spine. IMPRESSION: Small bowel obstruction with transition zone in RIGHT mid abdomen, of uncertain etiology. Distal colonic diverticulosis without evidence of diverticulitis. Fatty infiltration of liver. Enlarged prostate gland including significant enlargement of the central lobe elevating and indenting bladder base. Infiltrates and atelectasis in BILATERAL lower lobes. Aortic Atherosclerosis (ICD10-I70.0). Electronically Signed   By: Ulyses SouthwardMark  Boles M.D.   On: 07/17/2022 08:47   DG Knee Left Port  Result Date: 07/13/2022 CLINICAL DATA:  Status post total left knee replacement EXAM: PORTABLE LEFT KNEE - 1-2 VIEW COMPARISON:  Radiograph 06/18/2022. FINDINGS: There is a 3 component total knee arthroplasty in normal alignment without evidence of loosening or periprosthetic fracture. Expected soft tissue changes including a joint effusion. IMPRESSION: Left total knee arthroplasty in normal alignment without evidence of immediate hardware complication. Electronically Signed    By: Caprice RenshawJacob  Kahn M.D.   On: 07/13/2022 16:30       The results of significant diagnostics from this hospitalization (including imaging, microbiology, ancillary and laboratory) are listed below for reference.     Microbiology: Recent Results (from the past 240 hour(s))  Culture, blood (routine x 2) Call MD if unable to obtain prior to antibiotics being given     Status: None (Preliminary result)   Collection Time: 07/17/22  9:31 PM   Specimen: BLOOD  Result Value Ref Range Status   Specimen Description   Final    BLOOD BLOOD LEFT ARM Performed at [facility], [address] [place], [place] [address]    Special Requests   Final    BOTTLES DRAWN AEROBIC AND ANAEROBIC Blood Culture results may not be optimal due to an excessive volume of blood received in culture bottles Performed at [facility], [address] [place], [place] [address]    Culture   Final    NO GROWTH  1 DAY Performed at [facility], [address] [place], [place] [address]    Report Status PENDING  Incomplete  Culture, blood (routine x 2) Call MD if unable to obtain prior to antibiotics being given     Status: None (Preliminary result)   Collection Time: 07/17/22  9:31 PM   Specimen: BLOOD  Result Value Ref Range Status   Specimen Description   Final    BLOOD BLOOD LEFT HAND Performed at [facility], [address], [place], [place] [address]    Special Requests   Final    BOTTLES DRAWN AEROBIC AND ANAEROBIC Blood Culture adequate volume Performed at [facility], [address] [place], [place] [address]    Culture   Final    NO GROWTH 1 DAY Performed at [facility], [address], [place], [place] [address]    Report Status PENDING  Incomplete  Resp panel by RT-PCR (RSV, Flu A&B, Covid) Anterior Nasal Swab     Status: None   Collection Time: 07/17/22 11:34 PM   Specimen: Anterior Nasal Swab  Result Value  Ref Range Status   SARS Coronavirus 2 by RT PCR NEGATIVE NEGATIVE Final    Comment: (NOTE) SARS-CoV-2 target nucleic acids are NOT DETECTED.  The SARS-CoV-2 RNA is generally detectable in upper respiratory specimens during the acute phase of infection. The lowest concentration of SARS-CoV-2 viral copies this assay can detect is 138 copies/mL. A negative result does not preclude SARS-Cov-2 infection and should not be used as the sole basis for treatment or other patient management decisions. A negative result may occur with  improper specimen collection/handling, submission of specimen other than nasopharyngeal swab, presence of viral mutation(s) within the areas targeted by this assay, and inadequate number of viral copies(<138 copies/mL). A negative result must be combined with clinical observations, patient history, and epidemiological information. The expected result is Negative.  Fact Sheet for Patients:  BloggerCourse.com  Fact Sheet for Healthcare Providers:  SeriousBroker.it  This test is no t yet approved or cleared by the [place] FDA and  has been authorized for detection and/or diagnosis of SARS-CoV-2 by FDA under an Emergency Use Authorization (EUA). This EUA will remain  in effect (meaning this test can be used) for the duration of the COVID-19 declaration under Section 564(b)(1) of the Act, 21 U.S.C.section 360bbb-3(b)(1), unless the authorization is terminated  or revoked sooner.       Influenza A by PCR NEGATIVE NEGATIVE Final   Influenza B by PCR NEGATIVE NEGATIVE Final    Comment: (NOTE) The Xpert Xpress SARS-CoV-2/FLU/RSV plus assay is intended as an aid in the diagnosis of influenza from Nasopharyngeal swab specimens and should not be used as a sole basis for treatment. Nasal washings and aspirates are unacceptable for Xpert Xpress SARS-CoV-2/FLU/RSV testing.  Fact Sheet for  Patients: BloggerCourse.com  Fact Sheet for Healthcare Providers: SeriousBroker.it  This test is not yet approved or cleared by the [place] FDA and has been authorized for detection and/or diagnosis of SARS-CoV-2 by FDA under an Emergency Use Authorization (EUA). This EUA will remain in effect (meaning this test can be used) for the duration of the COVID-19 declaration under Section 564(b)(1) of the Act, 21 U.S.C. section 360bbb-3(b)(1), unless the authorization is terminated or revoked.     Resp Syncytial Virus by PCR NEGATIVE NEGATIVE Final    Comment: (NOTE) Fact Sheet for Patients: BloggerCourse.com  Fact Sheet for Healthcare Providers: SeriousBroker.it  This  test is not yet approved or cleared by the [place] and has been authorized for detection and/or diagnosis of SARS-CoV-2 by FDA under an Emergency Use Authorization (EUA). This EUA will remain in effect (meaning this test can be used) for the duration of the COVID-19 declaration under Section 564(b)(1) of the Act, 21 U.S.C. section 360bbb-3(b)(1), unless the authorization is terminated or revoked.  Performed at [facility], [address], [place], [place] [address]      Labs:  CBC: Recent Labs  Lab 07/14/22 0417 07/17/22 0534 07/17/22 2131 07/18/22 0048 07/19/22 0408  WBC 12.7* 9.1 6.6 6.6 4.9  NEUTROABS  --  7.3  --   --   --   HGB 14.1 13.7 12.5* 12.4* 12.2*  HCT 42.4 41.8 38.9* 38.0* 37.9*  MCV 92.2 90.9 93.7 93.1 95.5  PLT 157 215 208 196 191   BMP &GFR Recent Labs  Lab 07/14/22 0417 07/17/22 0534 07/17/22 2131 07/18/22 0048 07/19/22 0408  NA 142 141  --  143 146*  K 4.4 4.0  --  3.8 3.7  CL 111 101  --  100 109  CO2 23 28  --  32 29  GLUCOSE 184* 150*  --  135* 102*  BUN 33* 49*  --  51* 37*  CREATININE 1.16 1.47* 1.31* 1.26* 1.24  CALCIUM 8.7* 9.2  --   8.8* 8.6*  MG  --  2.3  --   --  2.4  PHOS  --  3.4  --   --  3.4   Estimated Creatinine Clearance: 64.2 mL/min (by C-G formula based on SCr of 1.24 mg/dL). Liver & Pancreas: Recent Labs  Lab 07/17/22 0809 07/18/22 0048 07/19/22 0408  AST 38 29  --   ALT 74* 63*  --   ALKPHOS 53 40  --   BILITOT 1.8* 1.2  --   PROT 7.0 6.1*  --   ALBUMIN 3.5 2.9* 2.9*   Recent Labs  Lab 07/17/22 0809  LIPASE 26   No results for input(s): "AMMONIA" in the last 168 hours. Diabetic: Recent Labs    07/17/22 2131  HGBA1C 5.4   Recent Labs  Lab 07/18/22 2232  GLUCAP 107*   Cardiac Enzymes: No results for input(s): "CKTOTAL", "CKMB", "CKMBINDEX", "TROPONINI" in the last 168 hours. No results for input(s): "PROBNP" in the last 8760 hours. Coagulation Profile: No results for input(s): "INR", "PROTIME" in the last 168 hours. Thyroid Function Tests: Recent Labs    07/17/22 2131  TSH 1.021   Lipid Profile: No results for input(s): "CHOL", "HDL", "LDLCALC", "TRIG", "CHOLHDL", "LDLDIRECT" in the last 72 hours. Anemia Panel: No results for input(s): "VITAMINB12", "FOLATE", "FERRITIN", "TIBC", "IRON", "RETICCTPCT" in the last 72 hours. Urine analysis:    Component Value Date/Time   COLORURINE YELLOW 07/17/2022 1330   APPEARANCEUR CLEAR 07/17/2022 1330   LABSPEC 1.010 07/17/2022 1330   PHURINE 5.0 07/17/2022 1330   GLUCOSEU NEGATIVE 07/17/2022 1330   HGBUR NEGATIVE 07/17/2022 1330   BILIRUBINUR NEGATIVE 07/17/2022 1330   KETONESUR NEGATIVE 07/17/2022 1330   PROTEINUR 100 (A) 07/17/2022 1330   NITRITE NEGATIVE 07/17/2022 1330   LEUKOCYTESUR NEGATIVE 07/17/2022 1330   Sepsis Labs: Invalid input(s): "PROCALCITONIN", "LACTICIDVEN"   SIGNED:  Almon Hercules, MD  [facility] 07/19/2022, 6:12 PM

## 2022-07-20 DIAGNOSIS — N400 Enlarged prostate without lower urinary tract symptoms: Secondary | ICD-10-CM | POA: Diagnosis not present

## 2022-07-20 DIAGNOSIS — K56609 Unspecified intestinal obstruction, unspecified as to partial versus complete obstruction: Secondary | ICD-10-CM | POA: Diagnosis not present

## 2022-07-20 DIAGNOSIS — J690 Pneumonitis due to inhalation of food and vomit: Secondary | ICD-10-CM | POA: Diagnosis not present

## 2022-07-20 DIAGNOSIS — N179 Acute kidney failure, unspecified: Secondary | ICD-10-CM | POA: Diagnosis not present

## 2022-07-20 NOTE — Discharge Summary (Signed)
Physician Discharge Summary  Brandon Blackwell [id number] DOB: 1951/07/04 DOA: 07/17/2022  PCP: Lilian Coma., MD  Admit date: 07/17/2022 Discharge date: 07/20/2022 Admitted From: Home. Disposition: Home. Recommendations for Outpatient Follow-up:  Follow up with PCP in 1 to 2 weeks or sooner if needed Outpatient follow-up with orthopedic surgery as previously planned Check CBC and CMP in 1 to 2 weeks Please follow up on the following pending results: None  Home Health: Not indicated Equipment/Devices: Not indicated  Discharge Condition: Stable CODE STATUS: Full code  Follow-up Information     Lilian Coma., MD. Schedule an appointment as soon as possible for a visit in 1 week(s).   Specialty: Internal Medicine Contact information: [address] [place] [address] [phone]                 Hospital course 71 year old M with PMH of osteoarthritis s/p left TKR 4 days prior to presentation presenting with vomiting, abdominal pain and constipation for 3 days.  Has not had bowel movement since surgery.  He was found to have small bowel obstruction with transition zone in right mid abdomen.  CT also showed diverticulosis without diverticulitis, prostamegaly and fatty liver.  No prior abdominal surgery or SBO.  NG tube placed.  General surgery consulted.   Patient continued to have bowel movements.  Eventually, nausea and vomiting resolved as well.  He tolerated soft diet and had bowel movements.  He is cleared for discharge by general surgery.  He is discharged on p.o. MiraLAX.  Advised to avoid opiates.    AKI resolved.  In regards to possible aspiration pneumonia, he did not have significant respiratory symptoms.  He received IV Unasyn for 3 days.  He is discharged on p.o. Augmentin for 2 more days to complete a total of 5 days course.  See individual problem list below for more.   Problems addressed during this hospitalization Principal  Problem:   SBO (small bowel obstruction) (HCC) Active Problems:   Status post total left knee replacement   Gastroesophageal reflux disease without esophagitis   BPH (benign prostatic hyperplasia)   AKI (acute kidney injury) ([place])   Aspiration pneumonia (HCC)              Vital signs Vitals:   07/19/22 1414 07/19/22 2239 07/20/22 0500 07/20/22 0618  BP: (!) 154/77 (!) 143/77  (!) 155/71  Pulse: 75 65  66  Temp: 98.2 F (36.8 C) 98.2 F (36.8 C)  98.3 F (36.8 C)  Resp: 18 16  20   Height:      Weight:   95.9 kg   SpO2: 96% 95%  94%  TempSrc: Oral Oral  Oral  BMI (Calculated):   28.68      Discharge exam  GENERAL: No apparent distress.  Nontoxic. HEENT: MMM.  Vision and hearing grossly intact.  NECK: Supple.  No apparent JVD.  RESP:  No IWOB.  Fair aeration bilaterally. CVS:  RRR. Heart sounds normal.  ABD/GI/GU: BS+. Abd soft, NTND.  MSK/EXT:  Moves extremities.  Dressing over left knee DCI. SKIN: no apparent skin lesion or wound NEURO: Awake and alert. Oriented appropriately.  No apparent focal neuro deficit. PSYCH: Calm. Normal affect.   Discharge Instructions Discharge Instructions     Call MD for:  extreme fatigue   Complete by: As directed    Call MD for:  persistant nausea and vomiting   Complete by: As directed    Call MD for:  severe uncontrolled  pain   Complete by: As directed    Diet general   Complete by: As directed    Discharge instructions   Complete by: As directed    It has been a pleasure taking care of you!  You were hospitalized for possible bowel obstruction/ileus.  We suspect this is related to pain medication after your recent surgery.  Your symptoms resolved.  You may continue using MiraLAX as needed for constipation.  Follow-up with your primary care doctor in 1 to 2 weeks or sooner if needed.   Take care,   Increase activity slowly   Complete by: As directed       Allergies as of 07/20/2022   No Known Allergies       Medication List     STOP taking these medications    methocarbamol 500 MG tablet Commonly known as: ROBAXIN   oxyCODONE 5 MG immediate release tablet Commonly known as: Oxy IR/ROXICODONE       TAKE these medications    acetaminophen 325 MG tablet Commonly known as: TYLENOL Take 2 tablets (650 mg total) by mouth every 6 (six) hours as needed for moderate pain or mild pain. What changed: reasons to take this   amoxicillin-clavulanate 875-125 MG tablet Commonly known as: AUGMENTIN Take 1 tablet by mouth 2 (two) times daily for 2 days.   ARTIFICIAL TEAR SOLUTION OP Place 1 drop into both eyes daily as needed (dry eyes).   aspirin 81 MG chewable tablet Chew 1 tablet (81 mg total) by mouth 2 (two) times daily.   multivitamin with minerals Tabs tablet Take 1 tablet by mouth daily.   ondansetron 4 MG tablet Commonly known as: Zofran Take 1 tablet (4 mg total) by mouth every 8 (eight) hours as needed for nausea or vomiting.   polyethylene glycol powder 17 GM/SCOOP powder Commonly known as: MiraLax Take 17 g by mouth 2 (two) times daily as needed for mild constipation.   sildenafil 20 MG tablet Commonly known as: REVATIO Take 40-100 mg by mouth daily as needed (ED).   tadalafil 5 MG tablet Commonly known as: CIALIS Take 5 mg by mouth daily.        Consultations: General surgery  Procedures/Studies:   DG Abd Portable 2V  Result Date: 07/18/2022 CLINICAL DATA:  Postoperative ileus EXAM: PORTABLE ABDOMEN - 2 VIEW COMPARISON:  Portable exam 1429 hours compared to 0056 hours; correlation CT abdomen and pelvis 07/17/2022 FINDINGS: Nasogastric tube projects over proximal stomach. Persistent gaseous distension of small bowel loops throughout upper and mid abdomen. Paucity of colonic gas and stool. No definite bowel wall thickening or free air. Mild bibasilar atelectasis. Excreted contrast material within urinary bladder. Osseous structures unremarkable. IMPRESSION:  Dilated small bowel loops with paucity of colonic gas and stool; the pattern may reflect postoperative ileus or bowel obstruction but prior CT exam was most consistent with mid to distal small bowel obstruction. Electronically Signed   By: Ulyses Southward M.D.   On: 07/18/2022 14:54   DG Abd 1 View  Result Date: 07/18/2022 CLINICAL DATA:  [id number] Encounter for nasogastric (NG) tube placement [id number] EXAM: ABDOMEN - 1 VIEW COMPARISON:  CT abdomen pelvis 07/17/2022. FINDINGS: Enteric tube with tip and side port overlying the expected region the gastric lumen. Gaseous dilatation of several loops of small bowel noted. No radio-opaque calculi or other significant radiographic abnormality are seen. IMPRESSION: 1. Enteric tube with tip and side port overlying the expected region the gastric lumen. 2. Small-bowel obstruction. Electronically Signed  By: Iven Finn M.D.   On: 07/18/2022 01:24   CT ABDOMEN PELVIS W CONTRAST  Result Date: 07/17/2022 CLINICAL DATA:  Vomiting Saturday night, suspected bowel obstruction, recent LEFT total knee arthroplasty EXAM: CT ABDOMEN AND PELVIS WITH CONTRAST TECHNIQUE: Multidetector CT imaging of the abdomen and pelvis was performed using the standard protocol following bolus administration of intravenous contrast. RADIATION DOSE REDUCTION: This exam was performed according to the departmental dose-optimization program which includes automated exposure control, adjustment of the mA and/or kV according to patient size and/or use of iterative reconstruction technique. CONTRAST:  158mL OMNIPAQUE IOHEXOL 300 MG/ML SOLN IV. No oral contrast. COMPARISON:  None Available. FINDINGS: Lower chest: Infiltrates and atelectasis in BILATERAL lower lobes Hepatobiliary: Gallbladder unremarkable. Fatty infiltration of liver. No biliary dilatation. Pancreas: Normal appearance Spleen: Normal appearance Adrenals/Urinary Tract: Adrenal glands normal appearance. BILATERAL simple appearing renal cysts up  to 3.9 x 3.0 cm RIGHT and 2.0 x 1.8 cm LEFT; no follow-up imaging recommended. No urinary tract calcification or dilatation. Ureters and bladder unremarkable. Stomach/Bowel: Distended stomach and proximal small bowel loops with decompressed distal small bowel loops consistent with small bowel obstruction. Transition from dilated to nondilated small bowel occurs in RIGHT mid abdomen. No obvious mass or wall thickening at transition site. Diverticulosis of descending and sigmoid colon without evidence of diverticulitis. Scattered diverticula proximal colon. Normal appendix. Vascular/Lymphatic: Atherosclerotic calcifications aorta. No adenopathy. Reproductive: Enlarged prostate gland 5.1 x 4.3 x 6.6 cm, including significant enlargement of the central lobe elevating and indenting bladder base. Nonspecific focus LEFT of midline 13 x 11 x 13 mm. Seminal vesicles unremarkable. Other: No free air or free fluid.  No hernia. Musculoskeletal: Degenerative disc disease changes lumbar spine. IMPRESSION: Small bowel obstruction with transition zone in RIGHT mid abdomen, of uncertain etiology. Distal colonic diverticulosis without evidence of diverticulitis. Fatty infiltration of liver. Enlarged prostate gland including significant enlargement of the central lobe elevating and indenting bladder base. Infiltrates and atelectasis in BILATERAL lower lobes. Aortic Atherosclerosis (ICD10-I70.0). Electronically Signed   By: Lavonia Dana M.D.   On: 07/17/2022 08:47   DG Knee Left Port  Result Date: 07/13/2022 CLINICAL DATA:  Status post total left knee replacement EXAM: PORTABLE LEFT KNEE - 1-2 VIEW COMPARISON:  Radiograph 06/18/2022. FINDINGS: There is a 3 component total knee arthroplasty in normal alignment without evidence of loosening or periprosthetic fracture. Expected soft tissue changes including a joint effusion. IMPRESSION: Left total knee arthroplasty in normal alignment without evidence of immediate hardware  complication. Electronically Signed   By: Maurine Simmering M.D.   On: 07/13/2022 16:30       The results of significant diagnostics from this hospitalization (including imaging, microbiology, ancillary and laboratory) are listed below for reference.     Microbiology: Recent Results (from the past 240 hour(s))  Culture, blood (routine x 2) Call MD if unable to obtain prior to antibiotics being given     Status: None (Preliminary result)   Collection Time: 07/17/22  9:31 PM   Specimen: BLOOD  Result Value Ref Range Status   Specimen Description   Final    BLOOD BLOOD LEFT ARM Performed at [facility], [place] [address] [place], [place] [address]    Special Requests   Final    BOTTLES DRAWN AEROBIC AND ANAEROBIC Blood Culture results may not be optimal due to an excessive volume of blood received in culture bottles Performed at [facility] [address] [place], [place] [address]  Culture   Final    NO GROWTH 2 DAYS Performed at [facility], [place] [address], [place], [place] [address]    Report Status PENDING  Incomplete  Culture, blood (routine x 2) Call MD if unable to obtain prior to antibiotics being given     Status: None (Preliminary result)   Collection Time: 07/17/22  9:31 PM   Specimen: BLOOD  Result Value Ref Range Status   Specimen Description   Final    BLOOD BLOOD LEFT HAND Performed at [facility] [address], [place], [place] [address]    Special Requests   Final    BOTTLES DRAWN AEROBIC AND ANAEROBIC Blood Culture adequate volume Performed at [facility] [address], [place], [place] [address]    Culture   Final    NO GROWTH 2 DAYS Performed at [facility] [address], [place], [place] [address]    Report Status PENDING  Incomplete  Resp panel by RT-PCR (RSV, Flu A&B, Covid) Anterior Nasal Swab     Status: None   Collection Time: 07/17/22 11:34 PM    Specimen: Anterior Nasal Swab  Result Value Ref Range Status   SARS Coronavirus 2 by RT PCR NEGATIVE NEGATIVE Final    Comment: (NOTE) SARS-CoV-2 target nucleic acids are NOT DETECTED.  The SARS-CoV-2 RNA is generally detectable in upper respiratory specimens during the acute phase of infection. The lowest concentration of SARS-CoV-2 viral copies this assay can detect is 138 copies/mL. A negative result does not preclude SARS-Cov-2 infection and should not be used as the sole basis for treatment or other patient management decisions. A negative result may occur with  improper specimen collection/handling, submission of specimen other than nasopharyngeal swab, presence of viral mutation(s) within the areas targeted by this assay, and inadequate number of viral copies(<138 copies/mL). A negative result must be combined with clinical observations, patient history, and epidemiological information. The expected result is Negative.  Fact Sheet for Patients:  EntrepreneurPulse.com.au  Fact Sheet for Healthcare Providers:  IncredibleEmployment.be  This test is no t yet approved or cleared by the [place] FDA and  has been authorized for detection and/or diagnosis of SARS-CoV-2 by FDA under an Emergency Use Authorization (EUA). This EUA will remain  in effect (meaning this test can be used) for the duration of the COVID-19 declaration under Section 564(b)(1) of the Act, 21 U.S.C.section 360bbb-3(b)(1), unless the authorization is terminated  or revoked sooner.       Influenza A by PCR NEGATIVE NEGATIVE Final   Influenza B by PCR NEGATIVE NEGATIVE Final    Comment: (NOTE) The Xpert Xpress SARS-CoV-2/FLU/RSV plus assay is intended as an aid in the diagnosis of influenza from Nasopharyngeal swab specimens and should not be used as a sole basis for treatment. Nasal washings and aspirates are unacceptable for Xpert Xpress  SARS-CoV-2/FLU/RSV testing.  Fact Sheet for Patients: EntrepreneurPulse.com.au  Fact Sheet for Healthcare Providers: IncredibleEmployment.be  This test is not yet approved or cleared by the [place] FDA and has been authorized for detection and/or diagnosis of SARS-CoV-2 by FDA under an Emergency Use Authorization (EUA). This EUA will remain in effect (meaning this test can be used) for the duration of the COVID-19 declaration under Section 564(b)(1) of the Act, 21 U.S.C. section 360bbb-3(b)(1), unless the authorization is terminated or revoked.     Resp Syncytial Virus by PCR NEGATIVE NEGATIVE Final    Comment: (NOTE) Fact Sheet for Patients: EntrepreneurPulse.com.au  Fact Sheet for Healthcare Providers: IncredibleEmployment.be  This test is not yet approved or cleared by the [place] FDA and has been authorized for detection and/or diagnosis of SARS-CoV-2 by FDA under an Emergency Use Authorization (EUA). This EUA will remain in effect (meaning this test can be used) for the duration of the COVID-19 declaration under Section 564(b)(1) of the Act, 21 U.S.C. section 360bbb-3(b)(1), unless the authorization is terminated or revoked.  Performed at [facility], [place] [address], [place], [place] [address]      Labs:  CBC: Recent Labs  Lab 07/14/22 0417 07/17/22 0534 07/17/22 2131 07/18/22 0048 07/19/22 0408  WBC 12.7* 9.1 6.6 6.6 4.9  NEUTROABS  --  7.3  --   --   --   HGB 14.1 13.7 12.5* 12.4* 12.2*  HCT 42.4 41.8 38.9* 38.0* 37.9*  MCV 92.2 90.9 93.7 93.1 95.5  PLT 157 215 208 196 191   BMP &GFR Recent Labs  Lab 07/14/22 0417 07/17/22 0534 07/17/22 2131 07/18/22 0048 07/19/22 0408  NA 142 141  --  143 146*  K 4.4 4.0  --  3.8 3.7  CL 111 101  --  100 109  CO2 23 28  --  32 29  GLUCOSE 184* 150*  --  135* 102*  BUN 33* 49*  --  51* 37*  CREATININE 1.16  1.47* 1.31* 1.26* 1.24  CALCIUM 8.7* 9.2  --  8.8* 8.6*  MG  --  2.3  --   --  2.4  PHOS  --  3.4  --   --  3.4   Estimated Creatinine Clearance: 63.7 mL/min (by C-G formula based on SCr of 1.24 mg/dL). Liver & Pancreas: Recent Labs  Lab 07/17/22 0809 07/18/22 0048 07/19/22 0408  AST 38 29  --   ALT 74* 63*  --   ALKPHOS 53 40  --   BILITOT 1.8* 1.2  --   PROT 7.0 6.1*  --   ALBUMIN 3.5 2.9* 2.9*   Recent Labs  Lab 07/17/22 0809  LIPASE 26   No results for input(s): "AMMONIA" in the last 168 hours. Diabetic: Recent Labs    07/17/22 2131  HGBA1C 5.4   Recent Labs  Lab 07/18/22 2232  GLUCAP 107*   Cardiac Enzymes: No results for input(s): "CKTOTAL", "CKMB", "CKMBINDEX", "TROPONINI" in the last 168 hours. No results for input(s): "PROBNP" in the last 8760 hours. Coagulation Profile: No results for input(s): "INR", "PROTIME" in the last 168 hours. Thyroid Function Tests: Recent Labs    07/17/22 2131  TSH 1.021   Lipid Profile: No results for input(s): "CHOL", "HDL", "LDLCALC", "TRIG", "CHOLHDL", "LDLDIRECT" in the last 72 hours. Anemia Panel: No results for input(s): "VITAMINB12", "FOLATE", "FERRITIN", "TIBC", "IRON", "RETICCTPCT" in the last 72 hours. Urine analysis:    Component Value Date/Time   COLORURINE YELLOW 07/17/2022 1330   APPEARANCEUR CLEAR 07/17/2022 1330   LABSPEC 1.010 07/17/2022 1330   PHURINE 5.0 07/17/2022 1330   GLUCOSEU NEGATIVE 07/17/2022 1330   HGBUR NEGATIVE 07/17/2022 1330   BILIRUBINUR NEGATIVE 07/17/2022 1330   KETONESUR NEGATIVE 07/17/2022 1330   PROTEINUR 100 (A) 07/17/2022 1330   NITRITE NEGATIVE 07/17/2022 1330   LEUKOCYTESUR NEGATIVE 07/17/2022 1330   Sepsis Labs: Invalid input(s): "PROCALCITONIN", "LACTICIDVEN"   SIGNED:  Mercy Riding, MD  [facility] 07/20/2022, 2:33 PM

## 2022-07-20 NOTE — Progress Notes (Signed)
Physical Therapy Treatment Patient Details Name: Brandon Blackwell MRN: [id number] DOB: August 12, 1950 Today's Date: 07/20/2022   History of Present Illness 71 y.o. male with only significant medical history significant of  arthritis s/p L TKR on 07/13/22 , who presents to ED with progressive vomiting and abdominal pain x the last 3 days. He notes since his surgery he had not has bowel movement. Dx of SBO.    PT Comments    Pt received standing in room, ambulating without RW. Pt's wife encouraged pt to participate in therapy. Reviewed L knee flexion exercises while sitting down, with use of towel to reduce friction and RLE or BUE to provide overpressure for increased ROM. Pt is able to ambulate around entire unit without AD with independence. Discussed benefits/need to use RW when ambulating outside over uneven ground & pt voices understanding. Also educated pt & wife on recommendation of OPPT vs HHPT & pt & wife in agreement & requesting [facility] -- case manager made aware.     Recommendations for follow up therapy are one component of a multi-disciplinary discharge planning process, led by the attending physician.  Recommendations may be updated based on patient status, additional functional criteria and insurance authorization.  Follow Up Recommendations  Outpatient PT     Assistance Recommended at Discharge PRN  Patient can return home with the following Assistance with cooking/housework;Assist for transportation   Equipment Recommendations  None recommended by PT    Recommendations for Other Services       Precautions / Restrictions Precautions Precautions: Fall;Knee Precaution Comments: no pillow under the knee Restrictions Weight Bearing Restrictions: No LLE Weight Bearing: Weight bearing as tolerated     Mobility  Bed Mobility               General bed mobility comments: not tested, pt recieved standing in room, left in recliner    Transfers Overall  transfer level: Independent Equipment used: None Transfers: Sit to/from Stand Sit to Stand: Independent                Ambulation/Gait Ambulation/Gait assistance: Independent Gait Distance (Feet): 350 Feet Assistive device: None Gait Pattern/deviations: Decreased weight shift to left Gait velocity: WNL     General Gait Details: slightly decreased L hip/knee flexion during swing phase, slightly decreased heel strike LLE   Stairs Stairs:  (pt declined, reports he feels comfortable with stair negotiation)           Wheelchair Mobility    Modified Rankin (Stroke Patients Only)       Balance Overall balance assessment: Needs assistance Sitting-balance support: Feet supported, No upper extremity supported Sitting balance-Leahy Scale: Normal     Standing balance support: During functional activity, No upper extremity supported Standing balance-Leahy Scale: Good                              Cognition Arousal/Alertness: Awake/alert Behavior During Therapy: WFL for tasks assessed/performed Overall Cognitive Status: Within Functional Limits for tasks assessed                                          Exercises Other Exercises Other Exercises: Reviewed seated L knee flexion with use of towel under foot to reduce friction, use of BUE or RLE to provide over pressure for increased L knee flexion.  General Comments        Pertinent Vitals/Pain Pain Assessment Pain Assessment: Faces Faces Pain Scale: Hurts a little bit Pain Location: L knee tightness during Flexion exercises Pain Descriptors / Indicators: Tightness Pain Intervention(s): Monitored during session    Home Living                          Prior Function            PT Goals (current goals can now be found in the care plan section) Acute Rehab PT Goals Patient Stated Goal: Gym, golf PT Goal Formulation: With patient/family Time For Goal Achievement:  07/26/22 Potential to Achieve Goals: Good Progress towards PT goals: Progressing toward goals    Frequency    7X/week      PT Plan Discharge plan needs to be updated    Co-evaluation              AM-PAC PT "6 Clicks" Mobility   Outcome Measure  Help needed turning from your back to your side while in a flat bed without using bedrails?: None Help needed moving from lying on your back to sitting on the side of a flat bed without using bedrails?: None Help needed moving to and from a bed to a chair (including a wheelchair)?: None Help needed standing up from a chair using your arms (e.g., wheelchair or bedside chair)?: None Help needed to walk in hospital room?: None Help needed climbing 3-5 steps with a railing? : None 6 Click Score: 24    End of Session   Activity Tolerance: Patient tolerated treatment well Patient left: in chair;with call bell/phone within reach;with family/visitor present   PT Visit Diagnosis: Other abnormalities of gait and mobility (R26.89)     Time: [id number] PT Time Calculation (min) (ACUTE ONLY): 14 min  Charges:  $Therapeutic Activity: 8-22 mins                     Brandon Blackwell, PT, DPT 07/20/22, 9:16 AM   Brandon Blackwell 07/20/2022, 9:15 AM

## 2022-07-20 NOTE — Plan of Care (Signed)
PT goals entered as late entry. All goals are appropriate at this time.  Problem: Acute Rehab PT Goals(only PT should resolve) Goal: Pt Will Go Up/Down Stairs Flowsheets (Taken 07/20/2022 0912) Pt will Go Up / Down Stairs:  3-5 stairs  Independently  with rail(s) Goal: Pt/caregiver will Perform Home Exercise Program Flowsheets (Taken 07/20/2022 0912) Pt/caregiver will Perform Home Exercise Program:  For increased ROM  For increased strengthening  Independently

## 2022-07-23 LAB — CULTURE, BLOOD (ROUTINE X 2)
Culture: NO GROWTH
Culture: NO GROWTH
Special Requests: ADEQUATE

## 2022-07-26 ENCOUNTER — Ambulatory Visit (INDEPENDENT_AMBULATORY_CARE_PROVIDER_SITE_OTHER): Payer: Medicare HMO | Admitting: Orthopaedic Surgery

## 2022-07-26 ENCOUNTER — Encounter: Payer: Self-pay | Admitting: Orthopaedic Surgery

## 2022-07-26 DIAGNOSIS — Z96652 Presence of left artificial knee joint: Secondary | ICD-10-CM

## 2022-07-26 NOTE — Addendum Note (Signed)
Addended by: Anniyah Mood L on: 07/26/2022 04:32 PM   Modules accepted: Orders  

## 2022-07-26 NOTE — Progress Notes (Signed)
The patient comes in today for his first postoperative appointment status post a left total knee arthroplasty.  Early last week he was hospitalized for small bowel obstruction.  This did fortunately resolve without any type of surgery.  Surprisingly he was very mobile and is already been off of pain medications when this occurred.  Fortunately he is doing better now.  He is only had some home therapy but no outpatient therapy yet.  On exam he lacks full extension by just a few degrees of that left knee but his flexion is already to just past 90 degrees.  There is bruising to be expected.  The staples have been removed and Steri-Strips applied from the incision.  His calf is soft.  He is wearing compressive stockings.  We will set him up for outpatient physical therapy at [facility] outpatient rehab at [facility] which is close to the patient where he lives.  Will then see him back in 4 weeks to see how he is doing overall.  He can drop from my standpoint.

## 2022-08-02 NOTE — Therapy (Signed)
OUTPATIENT PHYSICAL THERAPY LOWER EXTREMITY EVALUATION   Patient Name: Brandon Blackwell MRN: [id number] DOB:[birth year], 72 y.o., male Today's Date: 08/03/2022  END OF SESSION:  PT End of Session - 08/03/22 1053     Visit Number 1    Date for PT Re-Evaluation 10/26/22    PT Start Time 1015    PT Stop Time 1052    PT Time Calculation (min) 37 min    Activity Tolerance Patient tolerated treatment well    Behavior During Therapy WFL for tasks assessed/performed             Past Medical History:  Diagnosis Date   Arthritis    Past Surgical History:  Procedure Laterality Date   CATARACT EXTRACTION     NEUROMA SURGERY Bilateral    TOTAL KNEE ARTHROPLASTY Left 07/13/2022   Procedure: LEFT TOTAL KNEE ARTHROPLASTY;  Surgeon: Mcarthur Rossetti, MD;  Location: [facility];  Service: Orthopedics;  Laterality: Left;   WISDOM TOOTH EXTRACTION     Patient Active Problem List   Diagnosis Date Noted   AKI (acute kidney injury) ([place]) 07/18/2022   Aspiration pneumonia ([place]) 07/18/2022   SBO (small bowel obstruction) ([place]) 07/17/2022   Status post total left knee replacement 07/13/2022   Gastroesophageal reflux disease without esophagitis 06/21/2020   Polycythemia 06/21/2020   Labile blood pressure 06/21/2020   Unilateral primary osteoarthritis, left knee 09/14/2019   Left lumbar radiculopathy 07/29/2018   Erectile dysfunction 05/30/2015   BPH (benign prostatic hyperplasia) 01/26/2014    PCP: Brandon Coma, MD  REFERRING PROVIDER: Mcarthur Rossetti, MD  REFERRING DIAG: [phone] (ICD-10-CM) - Status post total left knee replacement   THERAPY DIAG:  Difficulty in walking, not elsewhere classified  Localized edema  Muscle weakness (generalized)  Other abnormalities of gait and mobility  Other lack of coordination  Presence of left artificial knee joint  Rationale for Evaluation and Treatment: Rehabilitation  ONSET DATE: 07/14/23  SUBJECTIVE:   SUBJECTIVE  STATEMENT: Patient reports that his knee surgery was uneventful, but he had complications from the anaesthesia. He has had 1 visit of HHPT and 2 visits in the hospital. He has been performing his HEP , emphasizing bending and straightening. Swelling is imporved, but remains in ankle and calf  PERTINENT HISTORY: Pt is a 72yo male presenting s/p L-TKA on 07/13/22. No significant PMH.  07/20/23 72 year old M with PMH of osteoarthritis s/p left TKR 4 days prior to presentation presenting with vomiting, abdominal pain and constipation for 3 days.  Has not had bowel movement since surgery.  He was found to have small bowel obstruction with transition zone in right mid abdomen.  CT also showed diverticulosis without diverticulitis, prostamegaly and fatty liver.  No prior abdominal surgery or SBO.  NG tube placed.  General surgery consulted.   PAIN:  Are you having pain? Yes: NPRS scale: 5/10 Pain location: worst in the calf and ankle, the knee is not very painful unless he pushes the bending. Pain description: aching Aggravating factors: pressure Relieving factors: Tylenol  PRECAUTIONS: None  WEIGHT BEARING RESTRICTIONS: No  FALLS:  Has patient fallen in last 6 months? No  LIVING ENVIRONMENT: Lives with: lives with their family Lives in: House/apartment Stairs: Yes: External: 4 steps; can reach both Has following equipment at home: None  OCCUPATION: retired  PLOF: Independent  PATIENT GOALS: Recover L knee function, return to normal activities, golf  NEXT MD VISIT: 08/22/22  OBJECTIVE:   DIAGNOSTIC FINDINGS: N/A  PATIENT SURVEYS:  FOTO 46.7  COGNITION: Overall cognitive status: Within functional limits for tasks assessed     SENSATION: Not tested  EDEMA:  Patient reports mild swelling remaining in L lower leg and ankle-wears TED  MUSCLE LENGTH: Hamstrings: Right 60 deg; Left 55 deg   POSTURE: No Significant postural limitations  PALPATION: Bruising on L medial thigh,  mildly TTP along L medial calf and ankle  LOWER EXTREMITY ROM:  Passive ROM Right eval Left eval  Hip flexion    Hip extension    Hip abduction    Hip adduction    Hip internal rotation    Hip external rotation    Knee flexion  98  Knee extension  8  Ankle dorsiflexion    Ankle plantarflexion    Ankle inversion    Ankle eversion     (Blank rows = not tested)  LOWER EXTREMITY MMT: L knee 3+/5, all other 4-5/5 on L, RLE 5/5    GAIT: Distance walked: In clinic distances Assistive device utilized: None Level of assistance: Complete Independence Comments: Mildly antalgic on L   TODAY'S TREATMENT:                                                                                                                              DATE:  08/03/22  Education,  SLR with ER, short kicks, knee press, seated knee ext and flex against red Tband resistance.  PATIENT EDUCATION:  Education details: POC, HEP Person educated: Patient Education method: Consulting civil engineer, Demonstration, and Handouts Education comprehension: verbalized understanding and returned demonstration  HOME EXERCISE PROGRAM:  [id number]  ASSESSMENT:  CLINICAL IMPRESSION: Patient is a 72 y.o. who was seen today for physical therapy evaluation and treatment for Post op L TKR. He had a bad reaction to the anaesthesia and had to be hospitalized for several days post op. He demonstrates weakness and stiffness in L knee, as to be expected. He will benefit from PT to facilitate progression to return to his PLOF.  OBJECTIVE IMPAIRMENTS: Abnormal gait, decreased activity tolerance, decreased balance, decreased coordination, decreased mobility, difficulty walking, decreased ROM, decreased strength, postural dysfunction, and pain.   ACTIVITY LIMITATIONS: bending, standing, squatting, sleeping, stairs, transfers, and locomotion level  PARTICIPATION LIMITATIONS: meal prep, cleaning, laundry, driving, shopping, and community  activity  PERSONAL FACTORS: Past/current experiences are also affecting patient's functional outcome.   REHAB POTENTIAL: Good  CLINICAL DECISION MAKING: Stable/uncomplicated  EVALUATION COMPLEXITY: Moderate   GOALS: Goals reviewed with patient? Yes  SHORT TERM GOALS: Target date: 08/22/22 I with basic HEP Baseline: Goal status: INITIAL   LONG TERM GOALS: Target date: 10/26/22  I with final HEP Baseline:  Goal status: INITIAL  2.  Increase FOTO score to at least 74 Baseline: 47 Goal status: INITIAL  3.  Increase L knee AROM to 0-120 Baseline: 8-98 Goal status: INITIAL  4.  Increase L knee strength to 5/5 Baseline: 3+ Goal status: INITIAL  5.  Patient will ambulate at least 800' with  no Ad, on level and unlevel surfaces, normal speed, no gait deviations. Baseline:  Goal status: INITIAL  6.  Up and down at least 4 steps with U rail, step over step Baseline:  Goal status: INITIAL   PLAN:  PT FREQUENCY: 1-2x/week  PT DURATION: 12 weeks  PLANNED INTERVENTIONS: Therapeutic exercises, Therapeutic activity, Neuromuscular re-education, Balance training, Gait training, Patient/Family education, Self Care, Joint mobilization, Stair training, Electrical stimulation, Cryotherapy, Moist heat, Vasopneumatic device, Ionotophoresis 4mg /ml Dexamethasone, and Manual therapy  PLAN FOR NEXT SESSION: Update HEP   Marcelina Morel, DPT 08/03/2022, 11:50 AM

## 2022-08-03 ENCOUNTER — Ambulatory Visit: Payer: Medicare HMO | Attending: Orthopaedic Surgery | Admitting: Physical Therapy

## 2022-08-03 ENCOUNTER — Encounter: Payer: Self-pay | Admitting: Physical Therapy

## 2022-08-03 DIAGNOSIS — Z96652 Presence of left artificial knee joint: Secondary | ICD-10-CM | POA: Insufficient documentation

## 2022-08-03 DIAGNOSIS — R600 Localized edema: Secondary | ICD-10-CM

## 2022-08-03 DIAGNOSIS — M6281 Muscle weakness (generalized): Secondary | ICD-10-CM | POA: Diagnosis present

## 2022-08-03 DIAGNOSIS — R262 Difficulty in walking, not elsewhere classified: Secondary | ICD-10-CM

## 2022-08-03 DIAGNOSIS — R278 Other lack of coordination: Secondary | ICD-10-CM | POA: Insufficient documentation

## 2022-08-03 DIAGNOSIS — R2689 Other abnormalities of gait and mobility: Secondary | ICD-10-CM | POA: Diagnosis present

## 2022-08-06 ENCOUNTER — Encounter: Payer: Self-pay | Admitting: Physical Therapy

## 2022-08-06 ENCOUNTER — Ambulatory Visit: Payer: Medicare HMO | Admitting: Physical Therapy

## 2022-08-06 DIAGNOSIS — R262 Difficulty in walking, not elsewhere classified: Secondary | ICD-10-CM

## 2022-08-06 DIAGNOSIS — R278 Other lack of coordination: Secondary | ICD-10-CM

## 2022-08-06 DIAGNOSIS — R2689 Other abnormalities of gait and mobility: Secondary | ICD-10-CM

## 2022-08-06 DIAGNOSIS — Z96652 Presence of left artificial knee joint: Secondary | ICD-10-CM

## 2022-08-06 DIAGNOSIS — R600 Localized edema: Secondary | ICD-10-CM

## 2022-08-06 NOTE — Therapy (Signed)
OUTPATIENT PHYSICAL THERAPY LOWER EXTREMITY EVALUATION   Patient Name: Brandon Blackwell MRN: [id number] DOB:[birth year], 72 y.o., male Today's Date: 08/06/2022  END OF SESSION:  PT End of Session - 08/06/22 1236     Visit Number 2    Date for PT Re-Evaluation 10/26/22    PT Start Time 1232    PT Stop Time 1310    PT Time Calculation (min) 38 min    Activity Tolerance Patient tolerated treatment well    Behavior During Therapy WFL for tasks assessed/performed              Past Medical History:  Diagnosis Date   Arthritis    Past Surgical History:  Procedure Laterality Date   CATARACT EXTRACTION     NEUROMA SURGERY Bilateral    TOTAL KNEE ARTHROPLASTY Left 07/13/2022   Procedure: LEFT TOTAL KNEE ARTHROPLASTY;  Surgeon: Mcarthur Rossetti, MD;  Location: [facility];  Service: Orthopedics;  Laterality: Left;   WISDOM TOOTH EXTRACTION     Patient Active Problem List   Diagnosis Date Noted   AKI (acute kidney injury) ([place]) 07/18/2022   Aspiration pneumonia ([place]) 07/18/2022   SBO (small bowel obstruction) ([place]) 07/17/2022   Status post total left knee replacement 07/13/2022   Gastroesophageal reflux disease without esophagitis 06/21/2020   Polycythemia 06/21/2020   Labile blood pressure 06/21/2020   Unilateral primary osteoarthritis, left knee 09/14/2019   Left lumbar radiculopathy 07/29/2018   Erectile dysfunction 05/30/2015   BPH (benign prostatic hyperplasia) 01/26/2014    PCP: Lilian Coma, MD  REFERRING PROVIDER: Mcarthur Rossetti, MD  REFERRING DIAG: [phone] (ICD-10-CM) - Status post total left knee replacement   THERAPY DIAG:  Difficulty in walking, not elsewhere classified  Localized edema  Other abnormalities of gait and mobility  Other lack of coordination  Presence of left artificial knee joint  Rationale for Evaluation and Treatment: Rehabilitation  ONSET DATE: 07/14/23  SUBJECTIVE:   SUBJECTIVE STATEMENT: Patient reports that his  knee pain is improving. He is managing steps, down is more challenging  PERTINENT HISTORY: Pt is a 72yo male presenting s/p L-TKA on 07/13/22. No significant PMH.  07/20/23 73 year old M with PMH of osteoarthritis s/p left TKR 4 days prior to presentation presenting with vomiting, abdominal pain and constipation for 3 days.  Has not had bowel movement since surgery.  He was found to have small bowel obstruction with transition zone in right mid abdomen.  CT also showed diverticulosis without diverticulitis, prostamegaly and fatty liver.  No prior abdominal surgery or SBO.  NG tube placed.  General surgery consulted.   PAIN:  Are you having pain? Yes: NPRS scale: 5/10 Pain location: worst in the calf and ankle, the knee is not very painful unless he pushes the bending. Pain description: aching Aggravating factors: pressure Relieving factors: Tylenol  PRECAUTIONS: None  WEIGHT BEARING RESTRICTIONS: No  FALLS:  Has patient fallen in last 6 months? No  LIVING ENVIRONMENT: Lives with: lives with their family Lives in: House/apartment Stairs: Yes: External: 4 steps; can reach both Has following equipment at home: None  OCCUPATION: retired  PLOF: Independent  PATIENT GOALS: Recover L knee function, return to normal activities, golf  NEXT MD VISIT: 08/22/22  OBJECTIVE:   DIAGNOSTIC FINDINGS: N/A  PATIENT SURVEYS:  FOTO 46.7  COGNITION: Overall cognitive status: Within functional limits for tasks assessed     SENSATION: Not tested  EDEMA:  Patient reports mild swelling remaining in L lower leg and ankle-wears TED  MUSCLE LENGTH:  Hamstrings: Right 60 deg; Left 55 deg   POSTURE: No Significant postural limitations  PALPATION: Bruising on L medial thigh, mildly TTP along L medial calf and ankle  LOWER EXTREMITY ROM:  Passive ROM Right eval Left eval  Hip flexion    Hip extension    Hip abduction    Hip adduction    Hip internal rotation    Hip external rotation     Knee flexion  98  Knee extension  8  Ankle dorsiflexion    Ankle plantarflexion    Ankle inversion    Ankle eversion     (Blank rows = not tested)  LOWER EXTREMITY MMT: L knee 3+/5, all other 4-5/5 on L, RLE 5/5    GAIT: Distance walked: In clinic distances Assistive device utilized: None Level of assistance: Complete Independence Comments: Mildly antalgic on L   TODAY'S TREATMENT:                                                                                                                              DATE:  08/06/22 NuStep L5 x 6 minutes Supine knee press with heel elevated 10 x 5 sec. Knee ext observably improved, not formally measured today. Short kicks, 5#, 1 x 10, 5 sec hold SLR with 5#, repeat with hip ER x 10 each Seated knee flex ROM 10 reps A knee flexion 106 Seated knee flexion, 20#, 2 x 10 LLE only. Seated knee ext 5#, 1 x 10, RLE only SLS on physiodisc with BUE support 1:30 Heel raises on black bar with BUE support for balance, 2 x 10 Mini squats in front of mat, 2 x 10 reps Standing hip abd against G tband with BUE support for balance, 2 x 10  08/03/22  Education,  SLR with ER, short kicks, knee press, seated knee ext and flex against red Tband resistance.  PATIENT EDUCATION:  Education details: POC, HEP Person educated: Patient Education method: Explanation, Demonstration, and Handouts Education comprehension: verbalized understanding and returned demonstration  HOME EXERCISE PROGRAM:  [id number] 08/06/22- added mini squats with LLE behind and standing hip abd with UE support G tband-  ASSESSMENT:  CLINICAL IMPRESSION: Patient demonstrates excellent progress. Is performing HEP. Unable to update due to the program being down, but verbally updated exercises. Progressed his strengthening and ROM, no issues.  OBJECTIVE IMPAIRMENTS: Abnormal gait, decreased activity tolerance, decreased balance, decreased coordination, decreased mobility, difficulty  walking, decreased ROM, decreased strength, postural dysfunction, and pain.   ACTIVITY LIMITATIONS: bending, standing, squatting, sleeping, stairs, transfers, and locomotion level  PARTICIPATION LIMITATIONS: meal prep, cleaning, laundry, driving, shopping, and community activity  PERSONAL FACTORS: Past/current experiences are also affecting patient's functional outcome.   REHAB POTENTIAL: Good  CLINICAL DECISION MAKING: Stable/uncomplicated  EVALUATION COMPLEXITY: Moderate   GOALS: Goals reviewed with patient? Yes  SHORT TERM GOALS: Target date: 08/22/22 I with basic HEP Baseline: Goal status: met   LONG TERM GOALS: Target date: 10/26/22  I  with final HEP Baseline:  Goal status: INITIAL  2.  Increase FOTO score to at least 74 Baseline: 47 Goal status: INITIAL  3.  Increase L knee AROM to 0-120 Baseline: 8-98 Goal status: ongoing  4.  Increase L knee strength to 5/5 Baseline: 3+ Goal status: INITIAL  5.  Patient will ambulate at least 800' with no Ad, on level and unlevel surfaces, normal speed, no gait deviations. Baseline:  Goal status: INITIAL  6.  Up and down at least 4 steps with U rail, step over step Baseline:  Goal status: INITIAL   PLAN:  PT FREQUENCY: 1-2x/week  PT DURATION: 12 weeks  PLANNED INTERVENTIONS: Therapeutic exercises, Therapeutic activity, Neuromuscular re-education, Balance training, Gait training, Patient/Family education, Self Care, Joint mobilization, Stair training, Electrical stimulation, Cryotherapy, Moist heat, Vasopneumatic device, Ionotophoresis 4mg /ml Dexamethasone, and Manual therapy  PLAN FOR NEXT SESSION: Update HEP   , DPT 08/06/2022, 1:13 PM

## 2022-08-08 ENCOUNTER — Ambulatory Visit: Payer: Medicare HMO | Admitting: Physical Therapy

## 2022-08-08 ENCOUNTER — Encounter: Payer: Self-pay | Admitting: Physical Therapy

## 2022-08-08 DIAGNOSIS — R262 Difficulty in walking, not elsewhere classified: Secondary | ICD-10-CM

## 2022-08-08 DIAGNOSIS — Z96652 Presence of left artificial knee joint: Secondary | ICD-10-CM

## 2022-08-08 DIAGNOSIS — R600 Localized edema: Secondary | ICD-10-CM

## 2022-08-08 DIAGNOSIS — R2689 Other abnormalities of gait and mobility: Secondary | ICD-10-CM

## 2022-08-08 DIAGNOSIS — R278 Other lack of coordination: Secondary | ICD-10-CM

## 2022-08-08 DIAGNOSIS — M6281 Muscle weakness (generalized): Secondary | ICD-10-CM

## 2022-08-08 NOTE — Therapy (Signed)
OUTPATIENT PHYSICAL THERAPY LOWER EXTREMITY EVALUATION   Patient Name: Brandon Blackwell MRN: [id number] DOB:1950-08-16, 72 y.o., male Today's Date: 08/08/2022  END OF SESSION:  PT End of Session - 08/08/22 1104     Visit Number 3    Date for PT Re-Evaluation 10/26/22    PT Start Time 1101    PT Stop Time 1140    PT Time Calculation (min) 39 min    Activity Tolerance Patient tolerated treatment well    Behavior During Therapy WFL for tasks assessed/performed              Past Medical History:  Diagnosis Date   Arthritis    Past Surgical History:  Procedure Laterality Date   CATARACT EXTRACTION     NEUROMA SURGERY Bilateral    TOTAL KNEE ARTHROPLASTY Left 07/13/2022   Procedure: LEFT TOTAL KNEE ARTHROPLASTY;  Surgeon: Kathryne Hitch, MD;  Location: [facility];  Service: Orthopedics;  Laterality: Left;   WISDOM TOOTH EXTRACTION     Patient Active Problem List   Diagnosis Date Noted   AKI (acute kidney injury) ([facility]) 07/18/2022   Aspiration pneumonia ([facility]) 07/18/2022   SBO (small bowel obstruction) ([facility]) 07/17/2022   Status post total left knee replacement 07/13/2022   Gastroesophageal reflux disease without esophagitis 06/21/2020   Polycythemia 06/21/2020   Labile blood pressure 06/21/2020   Unilateral primary osteoarthritis, left knee 09/14/2019   Left lumbar radiculopathy 07/29/2018   Erectile dysfunction 05/30/2015   BPH (benign prostatic hyperplasia) 01/26/2014    PCP: Malka So, MD  REFERRING PROVIDER: Kathryne Hitch, MD  REFERRING DIAG: [phone] (ICD-10-CM) - Status post total left knee replacement   THERAPY DIAG:  Difficulty in walking, not elsewhere classified  Localized edema  Other abnormalities of gait and mobility  Muscle weakness (generalized)  Presence of left artificial knee joint  Other lack of coordination  Rationale for Evaluation and Treatment: Rehabilitation  ONSET DATE: 07/14/23  SUBJECTIVE:   SUBJECTIVE  STATEMENT: Patient reports continued progress. He is performing HEP. Not taking any pain medication.  PERTINENT HISTORY: Pt is a 72yo male presenting s/p L-TKA on 07/13/22. No significant PMH.  07/20/23 72 year old M with PMH of osteoarthritis s/p left TKR 4 days prior to presentation presenting with vomiting, abdominal pain and constipation for 3 days.  Has not had bowel movement since surgery.  He was found to have small bowel obstruction with transition zone in right mid abdomen.  CT also showed diverticulosis without diverticulitis, prostamegaly and fatty liver.  No prior abdominal surgery or SBO.  NG tube placed.  General surgery consulted.   PAIN:  Are you having pain? Yes: NPRS scale: 5/10 Pain location: worst in the calf and ankle, the knee is not very painful unless he pushes the bending. Pain description: aching Aggravating factors: pressure Relieving factors: Tylenol  PRECAUTIONS: None  WEIGHT BEARING RESTRICTIONS: No  FALLS:  Has patient fallen in last 6 months? No  LIVING ENVIRONMENT: Lives with: lives with their family Lives in: House/apartment Stairs: Yes: External: 4 steps; can reach both Has following equipment at home: None  OCCUPATION: retired  PLOF: Independent  PATIENT GOALS: Recover L knee function, return to normal activities, golf  NEXT MD VISIT: 08/22/22  OBJECTIVE:   DIAGNOSTIC FINDINGS: N/A  PATIENT SURVEYS:  FOTO 46.7  COGNITION: Overall cognitive status: Within functional limits for tasks assessed     SENSATION: Not tested  EDEMA:  Patient reports mild swelling remaining in L lower leg and ankle-wears TED  MUSCLE  LENGTH: Hamstrings: Right 60 deg; Left 55 deg   POSTURE: No Significant postural limitations  PALPATION: Bruising on L medial thigh, mildly TTP along L medial calf and ankle  LOWER EXTREMITY ROM:  Passive ROM Right eval Left eval  Hip flexion    Hip extension    Hip abduction    Hip adduction    Hip internal  rotation    Hip external rotation    Knee flexion  98  Knee extension  8  Ankle dorsiflexion    Ankle plantarflexion    Ankle inversion    Ankle eversion     (Blank rows = not tested)  LOWER EXTREMITY MMT: L knee 3+/5, all other 4-5/5 on L, RLE 5/5    GAIT: Distance walked: In clinic distances Assistive device utilized: None Level of assistance: Complete Independence Comments: Mildly antalgic on L   TODAY'S TREATMENT:                                                                                                                              DATE:  08/08/22 Bike L3 x 6 minutes Supine knee press with heel elevated 10 x 5 sec L knee AAROM for ext 7 degrees Sit to stand on air ex with OHP of 6# ball, repeat with chest press, 10 each Step ups onto 6" step, forward, side step, crossover, 10 reps each, patient reports lateral and crossover step ups were more challenging. Seated knee flex, 20#, 2 x 10 reps L L knee ext 5#, 2 x 10 reps SLS, max of 10 sec, without UE support B side step against G tband resistance 3 x 7 each direction.   08/06/22 NuStep L5 x 6 minutes Supine knee press with heel elevated 10 x 5 sec. Knee ext observably improved, not formally measured today. Short kicks, 5#, 1 x 10, 5 sec hold SLR with 5#, repeat with hip ER x 10 each Seated knee flex ROM 10 reps A knee flexion 106 Seated knee flexion, 20#, 2 x 10 LLE only. Seated knee ext 5#, 1 x 10, RLE only SLS on physiodisc with BUE support 1:30 Heel raises on black bar with BUE support for balance, 2 x 10 Mini squats in front of mat, 2 x 10 reps Standing hip abd against G tband with BUE support for balance, 2 x 10  08/03/22  Education,  SLR with ER, short kicks, knee press, seated knee ext and flex against red Tband resistance.  PATIENT EDUCATION:  Education details: POC, HEP Person educated: Patient Education method: Explanation, Demonstration, and Handouts Education comprehension: verbalized  understanding and returned demonstration  HOME EXERCISE PROGRAM:  [id number] 08/06/22- added mini squats with LLE behind and standing hip abd with UE support G tband-  ASSESSMENT:  CLINICAL IMPRESSION: Patient continues to progress well, with improved strength and ROM. He tolerated more weight today.  OBJECTIVE IMPAIRMENTS: Abnormal gait, decreased activity tolerance, decreased balance, decreased coordination, decreased mobility, difficulty walking, decreased  ROM, decreased strength, postural dysfunction, and pain.   ACTIVITY LIMITATIONS: bending, standing, squatting, sleeping, stairs, transfers, and locomotion level  PARTICIPATION LIMITATIONS: meal prep, cleaning, laundry, driving, shopping, and community activity  PERSONAL FACTORS: Past/current experiences are also affecting patient's functional outcome.   REHAB POTENTIAL: Good  CLINICAL DECISION MAKING: Stable/uncomplicated  EVALUATION COMPLEXITY: Moderate   GOALS: Goals reviewed with patient? Yes  SHORT TERM GOALS: Target date: 08/22/22 I with basic HEP Baseline: Goal status: met   LONG TERM GOALS: Target date: 10/26/22  I with final HEP Baseline:  Goal status: INITIAL  2.  Increase FOTO score to at least 74 Baseline: 47 Goal status: INITIAL  3.  Increase L knee AROM to 0-120 Baseline: 8-98 Goal status: ongoing  4.  Increase L knee strength to 5/5 Baseline: 3+ Goal status: ongoing  5.  Patient will ambulate at least 800' with no Ad, on level and unlevel surfaces, normal speed, no gait deviations. Baseline:  Goal status: INITIAL  6.  Up and down at least 4 steps with U rail, step over step Baseline:  Goal status: INITIAL   PLAN:  PT FREQUENCY: 1-2x/week  PT DURATION: 12 weeks  PLANNED INTERVENTIONS: Therapeutic exercises, Therapeutic activity, Neuromuscular re-education, Balance training, Gait training, Patient/Family education, Self Care, Joint mobilization, Stair training, Electrical stimulation,  Cryotherapy, Moist heat, Vasopneumatic device, Ionotophoresis 4mg /ml Dexamethasone, and Manual therapy  PLAN FOR NEXT SESSION: Update HEP   Marcelina Morel, DPT 08/08/2022, 11:43 AM

## 2022-08-13 ENCOUNTER — Encounter: Payer: Self-pay | Admitting: Physical Therapy

## 2022-08-13 ENCOUNTER — Ambulatory Visit: Payer: Medicare HMO | Admitting: Physical Therapy

## 2022-08-13 DIAGNOSIS — M6281 Muscle weakness (generalized): Secondary | ICD-10-CM

## 2022-08-13 DIAGNOSIS — R262 Difficulty in walking, not elsewhere classified: Secondary | ICD-10-CM | POA: Diagnosis not present

## 2022-08-13 DIAGNOSIS — R600 Localized edema: Secondary | ICD-10-CM

## 2022-08-13 DIAGNOSIS — R2689 Other abnormalities of gait and mobility: Secondary | ICD-10-CM

## 2022-08-13 DIAGNOSIS — R278 Other lack of coordination: Secondary | ICD-10-CM

## 2022-08-13 DIAGNOSIS — Z96652 Presence of left artificial knee joint: Secondary | ICD-10-CM

## 2022-08-13 NOTE — Therapy (Signed)
OUTPATIENT PHYSICAL THERAPY LOWER EXTREMITY EVALUATION   Patient Name: Brandon Blackwell MRN: [id number] DOB:1951-02-11, 72 y.o., male Today's Date: 08/13/2022  END OF SESSION:  PT End of Session - 08/13/22 0933     Visit Number 4    Date for PT Re-Evaluation 10/26/22    PT Start Time 0930    PT Stop Time 1010    PT Time Calculation (min) 40 min    Activity Tolerance Patient tolerated treatment well    Behavior During Therapy WFL for tasks assessed/performed               Past Medical History:  Diagnosis Date   Arthritis    Past Surgical History:  Procedure Laterality Date   CATARACT EXTRACTION     NEUROMA SURGERY Bilateral    TOTAL KNEE ARTHROPLASTY Left 07/13/2022   Procedure: LEFT TOTAL KNEE ARTHROPLASTY;  Surgeon: Mcarthur Rossetti, MD;  Location: [facility];  Service: Orthopedics;  Laterality: Left;   WISDOM TOOTH EXTRACTION     Patient Active Problem List   Diagnosis Date Noted   AKI (acute kidney injury) ([place]) 07/18/2022   Aspiration pneumonia ([place]) 07/18/2022   SBO (small bowel obstruction) ([place]) 07/17/2022   Status post total left knee replacement 07/13/2022   Gastroesophageal reflux disease without esophagitis 06/21/2020   Polycythemia 06/21/2020   Labile blood pressure 06/21/2020   Unilateral primary osteoarthritis, left knee 09/14/2019   Left lumbar radiculopathy 07/29/2018   Erectile dysfunction 05/30/2015   BPH (benign prostatic hyperplasia) 01/26/2014    PCP: Lilian Coma, MD  REFERRING PROVIDER: Mcarthur Rossetti, MD  REFERRING DIAG: [phone] (ICD-10-CM) - Status post total left knee replacement   THERAPY DIAG:  Difficulty in walking, not elsewhere classified  Localized edema  Other abnormalities of gait and mobility  Other lack of coordination  Presence of left artificial knee joint  Muscle weakness (generalized)  Rationale for Evaluation and Treatment: Rehabilitation  ONSET DATE: 07/14/23  SUBJECTIVE:   SUBJECTIVE  STATEMENT: Patient reports no new issues. He was able to cros shis L leg over his R to don his sock.  PERTINENT HISTORY: Pt is a 72yo male presenting s/p L-TKA on 07/13/22. No significant PMH.  07/20/23 72 year old M with PMH of osteoarthritis s/p left TKR 4 days prior to presentation presenting with vomiting, abdominal pain and constipation for 3 days.  Has not had bowel movement since surgery.  He was found to have small bowel obstruction with transition zone in right mid abdomen.  CT also showed diverticulosis without diverticulitis, prostamegaly and fatty liver.  No prior abdominal surgery or SBO.  NG tube placed.  General surgery consulted.   PAIN:  Are you having pain? Yes: NPRS scale: 5/10 Pain location: worst in the calf and ankle, the knee is not very painful unless he pushes the bending. Pain description: aching Aggravating factors: pressure Relieving factors: Tylenol  PRECAUTIONS: None  WEIGHT BEARING RESTRICTIONS: No  FALLS:  Has patient fallen in last 6 months? No  LIVING ENVIRONMENT: Lives with: lives with their family Lives in: House/apartment Stairs: Yes: External: 4 steps; can reach both Has following equipment at home: None  OCCUPATION: retired  PLOF: Independent  PATIENT GOALS: Recover L knee function, return to normal activities, golf  NEXT MD VISIT: 08/22/22  OBJECTIVE:   DIAGNOSTIC FINDINGS: N/A  PATIENT SURVEYS:  FOTO 46.7  COGNITION: Overall cognitive status: Within functional limits for tasks assessed     SENSATION: Not tested  EDEMA:  Patient reports mild swelling remaining in  L lower leg and ankle-wears TED  MUSCLE LENGTH: Hamstrings: Right 60 deg; Left 55 deg   POSTURE: No Significant postural limitations  PALPATION: Bruising on L medial thigh, mildly TTP along L medial calf and ankle  LOWER EXTREMITY ROM:  Passive ROM Right eval Left eval  Hip flexion    Hip extension    Hip abduction    Hip adduction    Hip internal  rotation    Hip external rotation    Knee flexion  98  Knee extension  8  Ankle dorsiflexion    Ankle plantarflexion    Ankle inversion    Ankle eversion     (Blank rows = not tested)  LOWER EXTREMITY MMT: L knee 3+/5, all other 4-5/5 on L, RLE 5/5    GAIT: Distance walked: In clinic distances Assistive device utilized: None Level of assistance: Complete Independence Comments: Mildly antalgic on L   TODAY'S TREATMENT:                                                                                                                              DATE:  08/13/22 NuStep L5 x 6 minutes Supine knee press 10 x 5 sec with heel elevated Seated knee flexion 20#, L only, 2 x 10 reps Seated knee ext 10#, L only, 2 x 10 reps, emphasize full flex and ext with each exer. Alternating step taps while standing on air ex. Single taps, then progressed to multiple taps with each foot, 10 each TKE against ball on wall x 10 Side stepping up onto and off air ex pad, 10 reps each direction Sit to stand on airex with OHP of 6# ball, repeat with chest press, 10 reps each Leg press- BLE, 40#, 10 reps, 30# LLE 10 reps B side stepping on airex pad, 2 x 5 reps each way  08/08/22 Bike L3 x 6 minutes Supine knee press with heel elevated 10 x 5 sec L knee AAROM for ext 7 degrees Sit to stand on air ex with OHP of 6# ball, repeat with chest press, 10 each Step ups onto 6" step, forward, side step, crossover, 10 reps each, patient reports lateral and crossover step ups were more challenging. Seated knee flex, 20#, 2 x 10 reps L L knee ext 5#, 2 x 10 reps SLS, max of 10 sec, without UE support B side step against G tband resistance 3 x 7 each direction.   08/06/22 NuStep L5 x 6 minutes Supine knee press with heel elevated 10 x 5 sec. Knee ext observably improved, not formally measured today. Short kicks, 5#, 1 x 10, 5 sec hold SLR with 5#, repeat with hip ER x 10 each Seated knee flex ROM 10 reps A knee  flexion 106 Seated knee flexion, 20#, 2 x 10 LLE only. Seated knee ext 5#, 1 x 10, RLE only SLS on physiodisc with BUE support 1:30 Heel raises on black bar with BUE support for  balance, 2 x 10 Mini squats in front of mat, 2 x 10 reps Standing hip abd against G tband with BUE support for balance, 2 x 10  08/03/22  Education,  SLR with ER, short kicks, knee press, seated knee ext and flex against red Tband resistance.  PATIENT EDUCATION:  Education details: POC, HEP Person educated: Patient Education method: Explanation, Demonstration, and Handouts Education comprehension: verbalized understanding and returned demonstration  HOME EXERCISE PROGRAM:  [id number] 08/06/22- added mini squats with LLE behind and standing hip abd with UE support G tband-  ASSESSMENT:  CLINICAL IMPRESSION: Patient continues to progress well. Increased strengthening with increased weight and or reps today. Able to complete all activities.  OBJECTIVE IMPAIRMENTS: Abnormal gait, decreased activity tolerance, decreased balance, decreased coordination, decreased mobility, difficulty walking, decreased ROM, decreased strength, postural dysfunction, and pain.   ACTIVITY LIMITATIONS: bending, standing, squatting, sleeping, stairs, transfers, and locomotion level  PARTICIPATION LIMITATIONS: meal prep, cleaning, laundry, driving, shopping, and community activity  PERSONAL FACTORS: Past/current experiences are also affecting patient's functional outcome.   REHAB POTENTIAL: Good  CLINICAL DECISION MAKING: Stable/uncomplicated  EVALUATION COMPLEXITY: Moderate   GOALS: Goals reviewed with patient? Yes  SHORT TERM GOALS: Target date: 08/22/22 I with basic HEP Baseline: Goal status: met   LONG TERM GOALS: Target date: 10/26/22  I with final HEP Baseline:  Goal status: INITIAL  2.  Increase FOTO score to at least 74 Baseline: 47 Goal status: ongoing  3.  Increase L knee AROM to 0-120 Baseline: 8-98 Goal  status: ongoing  4.  Increase L knee strength to 5/5 Baseline: 3+ Goal status: ongoing  5.  Patient will ambulate at least 800' with no Ad, on level and unlevel surfaces, normal speed, no gait deviations. Baseline:  Goal status: INITIAL  6.  Up and down at least 4 steps with U rail, step over step Baseline:  Goal status: INITIAL   PLAN:  PT FREQUENCY: 1-2x/week  PT DURATION: 12 weeks  PLANNED INTERVENTIONS: Therapeutic exercises, Therapeutic activity, Neuromuscular re-education, Balance training, Gait training, Patient/Family education, Self Care, Joint mobilization, Stair training, Electrical stimulation, Cryotherapy, Moist heat, Vasopneumatic device, Ionotophoresis 4mg /ml Dexamethasone, and Manual therapy  PLAN FOR NEXT SESSION: Update HEP, add lateral strength and stability exer   , DPT 08/13/2022, 10:09 AM

## 2022-08-15 ENCOUNTER — Encounter: Payer: Self-pay | Admitting: Physical Therapy

## 2022-08-15 ENCOUNTER — Ambulatory Visit: Payer: Medicare HMO | Admitting: Physical Therapy

## 2022-08-15 DIAGNOSIS — R600 Localized edema: Secondary | ICD-10-CM

## 2022-08-15 DIAGNOSIS — M6281 Muscle weakness (generalized): Secondary | ICD-10-CM

## 2022-08-15 DIAGNOSIS — R2689 Other abnormalities of gait and mobility: Secondary | ICD-10-CM

## 2022-08-15 DIAGNOSIS — R262 Difficulty in walking, not elsewhere classified: Secondary | ICD-10-CM | POA: Diagnosis not present

## 2022-08-15 DIAGNOSIS — R278 Other lack of coordination: Secondary | ICD-10-CM

## 2022-08-15 DIAGNOSIS — Z96652 Presence of left artificial knee joint: Secondary | ICD-10-CM

## 2022-08-15 NOTE — Therapy (Signed)
OUTPATIENT PHYSICAL THERAPY LOWER EXTREMITY EVALUATION   Patient Name: Brandon Blackwell MRN: [id number] DOB:[birth year], 72 y.o., male Today's Date: 08/15/2022  END OF SESSION:  PT End of Session - 08/15/22 0931     Visit Number 5    Date for PT Re-Evaluation 10/26/22    PT Start Time 0927    PT Stop Time 1010    PT Time Calculation (min) 43 min    Activity Tolerance Patient tolerated treatment well    Behavior During Therapy [facility] for tasks assessed/performed               Past Medical History:  Diagnosis Date   Arthritis    Past Surgical History:  Procedure Laterality Date   CATARACT EXTRACTION     NEUROMA SURGERY Bilateral    TOTAL KNEE ARTHROPLASTY Left 07/13/2022   Procedure: LEFT TOTAL KNEE ARTHROPLASTY;  Surgeon: Mcarthur Rossetti, MD;  Location: [facility];  Service: Orthopedics;  Laterality: Left;   WISDOM TOOTH EXTRACTION     Patient Active Problem List   Diagnosis Date Noted   AKI (acute kidney injury) ([place]) 07/18/2022   Aspiration pneumonia ([place]) 07/18/2022   SBO (small bowel obstruction) ([place]) 07/17/2022   Status post total left knee replacement 07/13/2022   Gastroesophageal reflux disease without esophagitis 06/21/2020   Polycythemia 06/21/2020   Labile blood pressure 06/21/2020   Unilateral primary osteoarthritis, left knee 09/14/2019   Left lumbar radiculopathy 07/29/2018   Erectile dysfunction 05/30/2015   BPH (benign prostatic hyperplasia) 01/26/2014    PCP: Lilian Coma, MD  REFERRING PROVIDER: Mcarthur Rossetti, MD  REFERRING DIAG: [phone] (ICD-10-CM) - Status post total left knee replacement   THERAPY DIAG:  Difficulty in walking, not elsewhere classified  Localized edema  Other abnormalities of gait and mobility  Muscle weakness (generalized)  Presence of left artificial knee joint  Other lack of coordination  Rationale for Evaluation and Treatment: Rehabilitation  ONSET DATE: 07/14/23  SUBJECTIVE:   SUBJECTIVE  STATEMENT: Patient reports no new issues. He was able to cros shis L leg over his R to don his sock.  PERTINENT HISTORY: Pt is a 72yo male presenting s/p L-TKA on 07/13/22. No significant PMH.  07/20/23 72 year old M with PMH of osteoarthritis s/p left TKR 4 days prior to presentation presenting with vomiting, abdominal pain and constipation for 3 days.  Has not had bowel movement since surgery.  He was found to have small bowel obstruction with transition zone in right mid abdomen.  CT also showed diverticulosis without diverticulitis, prostamegaly and fatty liver.  No prior abdominal surgery or SBO.  NG tube placed.  General surgery consulted.   PAIN:  Are you having pain? Yes: NPRS scale: 5/10 Pain location: worst in the calf and ankle, the knee is not very painful unless he pushes the bending. Pain description: aching Aggravating factors: pressure Relieving factors: Tylenol  PRECAUTIONS: None  WEIGHT BEARING RESTRICTIONS: No  FALLS:  Has patient fallen in last 6 months? No  LIVING ENVIRONMENT: Lives with: lives with their family Lives in: House/apartment Stairs: Yes: External: 4 steps; can reach both Has following equipment at home: None  OCCUPATION: retired  PLOF: Independent  PATIENT GOALS: Recover L knee function, return to normal activities, golf  NEXT MD VISIT: 08/22/22  OBJECTIVE:   DIAGNOSTIC FINDINGS: N/A  PATIENT SURVEYS:  FOTO 46.7  COGNITION: Overall cognitive status: Within functional limits for tasks assessed     SENSATION: Not tested  EDEMA:  Patient reports mild swelling remaining in  L lower leg and ankle-wears TED  MUSCLE LENGTH: Hamstrings: Right 60 deg; Left 55 deg   POSTURE: No Significant postural limitations  PALPATION: Bruising on L medial thigh, mildly TTP along L medial calf and ankle  LOWER EXTREMITY ROM:  Passive ROM Right eval Left eval  Hip flexion    Hip extension    Hip abduction    Hip adduction    Hip internal  rotation    Hip external rotation    Knee flexion  98  Knee extension  8  Ankle dorsiflexion    Ankle plantarflexion    Ankle inversion    Ankle eversion     (Blank rows = not tested)  LOWER EXTREMITY MMT: L knee 3+/5, all other 4-5/5 on L, RLE 5/5    GAIT: Distance walked: In clinic distances Assistive device utilized: None Level of assistance: Complete Independence Comments: Mildly antalgic on L   TODAY'S TREATMENT:                                                                                                                              DATE:  08/15/22 NuStep L5 x 6 minutes Supine active knee press with foot elevated, 10 reps Seated L knee flex against 25#, 2 x 10 Seated L knee ext against 10#, 2 x 10 Seated eccentric knee ext with LLE, 15#, 10 reps Leg Press, BLE 40#, 10 reps Leg press, LLE 30#, 2 x 10 reps Quick steps side to side over 3 dowels on the floor, 10 reps each way, increasing speed. Forward and back step over dowel x 10 with each foot leading, increase speed. Double, then SLS on rocker board in lateral and ant/post positions, static stance, occasional UE support B side step on Airex plank x 5, no trouble Runners Step ups onto 6" step with LLE, 2 x 10 Treadmill pulls with LLE 2 x 10 reps Standing hip abd against G tband resistance, UUE support, 2 x 10 with each leg.  08/13/22 NuStep L5 x 6 minutes Supine knee press 10 x 5 sec with heel elevated Seated knee flexion 20#, L only, 2 x 10 reps Seated knee ext 10#, L only, 2 x 10 reps, emphasize full flex and ext with each exer. Alternating step taps while standing on air ex. Single taps, then progressed to multiple taps with each foot, 10 each TKE against ball on wall x 10 Side stepping up onto and off air ex pad, 10 reps each direction Sit to stand on airex with OHP of 6# ball, repeat with chest press, 10 reps each Leg press- BLE, 40#, 10 reps, 30# LLE 10 reps B side stepping on airex pad, 2 x 5 reps each  way  08/08/22 Bike L3 x 6 minutes Supine knee press with heel elevated 10 x 5 sec L knee AAROM for ext 7 degrees Sit to stand on air ex with OHP of 6# ball, repeat with chest press, 10 each  Step ups onto 6" step, forward, side step, crossover, 10 reps each, patient reports lateral and crossover step ups were more challenging. Seated knee flex, 20#, 2 x 10 reps L L knee ext 5#, 2 x 10 reps SLS, max of 10 sec, without UE support B side step against G tband resistance 3 x 7 each direction.   08/06/22 NuStep L5 x 6 minutes Supine knee press with heel elevated 10 x 5 sec. Knee ext observably improved, not formally measured today. Short kicks, 5#, 1 x 10, 5 sec hold SLR with 5#, repeat with hip ER x 10 each Seated knee flex ROM 10 reps A knee flexion 106 Seated knee flexion, 20#, 2 x 10 LLE only. Seated knee ext 5#, 1 x 10, RLE only SLS on physiodisc with BUE support 1:30 Heel raises on black bar with BUE support for balance, 2 x 10 Mini squats in front of mat, 2 x 10 reps Standing hip abd against G tband with BUE support for balance, 2 x 10  08/03/22  Education,  SLR with ER, short kicks, knee press, seated knee ext and flex against red Tband resistance.  PATIENT EDUCATION:  Education details: POC, HEP Person educated: Patient Education method: Explanation, Demonstration, and Handouts Education comprehension: verbalized understanding and returned demonstration  HOME EXERCISE PROGRAM:  [id number] 08/06/22- added mini squats with LLE behind and standing hip abd with UE support G tband-  ASSESSMENT:  CLINICAL IMPRESSION: Patient continues to progress well. Increased strength, speed of movement, and balance challenges, no issues.  OBJECTIVE IMPAIRMENTS: Abnormal gait, decreased activity tolerance, decreased balance, decreased coordination, decreased mobility, difficulty walking, decreased ROM, decreased strength, postural dysfunction, and pain.   ACTIVITY LIMITATIONS: bending,  standing, squatting, sleeping, stairs, transfers, and locomotion level  PARTICIPATION LIMITATIONS: meal prep, cleaning, laundry, driving, shopping, and community activity  PERSONAL FACTORS: Past/current experiences are also affecting patient's functional outcome.   REHAB POTENTIAL: Good  CLINICAL DECISION MAKING: Stable/uncomplicated  EVALUATION COMPLEXITY: Moderate   GOALS: Goals reviewed with patient? Yes  SHORT TERM GOALS: Target date: 08/22/22 I with basic HEP Baseline: Goal status: met   LONG TERM GOALS: Target date: 10/26/22  I with final HEP Baseline:  Goal status: INITIAL  2.  Increase FOTO score to at least 74 Baseline: 47 Goal status: ongoing  3.  Increase L knee AROM to 0-120 Baseline: 8-98 Goal status: ongoing  4.  Increase L knee strength to 5/5 Baseline: 3+ Goal status: ongoing  5.  Patient will ambulate at least 800' with no Ad, on level and unlevel surfaces, normal speed, no gait deviations. Baseline:  Goal status: INITIAL  6.  Up and down at least 4 steps with U rail, step over step Baseline:  Goal status: INITIAL   PLAN:  PT FREQUENCY: 1-2x/week  PT DURATION: 12 weeks  PLANNED INTERVENTIONS: Therapeutic exercises, Therapeutic activity, Neuromuscular re-education, Balance training, Gait training, Patient/Family education, Self Care, Joint mobilization, Stair training, Electrical stimulation, Cryotherapy, Moist heat, Vasopneumatic device, Ionotophoresis 4mg /ml Dexamethasone, and Manual therapy  PLAN FOR NEXT SESSION: Update HEP, add lateral strength and stability exer   , DPT 08/15/2022, 10:09 AM

## 2022-08-22 ENCOUNTER — Encounter: Payer: Self-pay | Admitting: Orthopaedic Surgery

## 2022-08-22 ENCOUNTER — Ambulatory Visit (INDEPENDENT_AMBULATORY_CARE_PROVIDER_SITE_OTHER): Payer: Medicare HMO | Admitting: Orthopaedic Surgery

## 2022-08-22 DIAGNOSIS — Z96652 Presence of left artificial knee joint: Secondary | ICD-10-CM

## 2022-08-22 NOTE — Progress Notes (Signed)
The patient is making excellent progress at 6 weeks status post a left total knee arthroplasty.  His postoperative course was slowed down by a small bowel obstruction.  He only needed 5 outpatient physical therapy sessions because he was able to easily transition to home exercise program given his determination and participation in getting his knee moving.  He still has muscle strain and pain which is to be expected at 6 weeks after surgery.  Examination of his left knee shows still moderate swelling.  I gave him reassurance that this will get lower as time goes by.  His extension is almost full and his flexion is almost full.  The knee feels ligamentously stable.  He will continue to increase activities as comfort allows.  Will see him back in 3 months with a standing AP and lateral of his left operative knee.  All questions and concerns were answered and addressed.

## 2022-09-03 ENCOUNTER — Telehealth: Payer: Self-pay | Admitting: Orthopaedic Surgery

## 2022-09-03 NOTE — Telephone Encounter (Signed)
Note completed and faxed

## 2022-09-03 NOTE — Telephone Encounter (Signed)
[place] from Dr. Marcello Moores DDS office called. Patient is having a procedure 12/17/2022. Would like something in writing that RX is not needed. Fax  number is [phone]

## 2022-09-26 IMAGING — CR DG CHEST 2V
2 series · 2 of 2 positions shown · non-contrast
Comparison: None.

CLINICAL DATA: Initial evaluation for acute cough.

EXAM:
CHEST - 2 VIEW

[w chest pa]
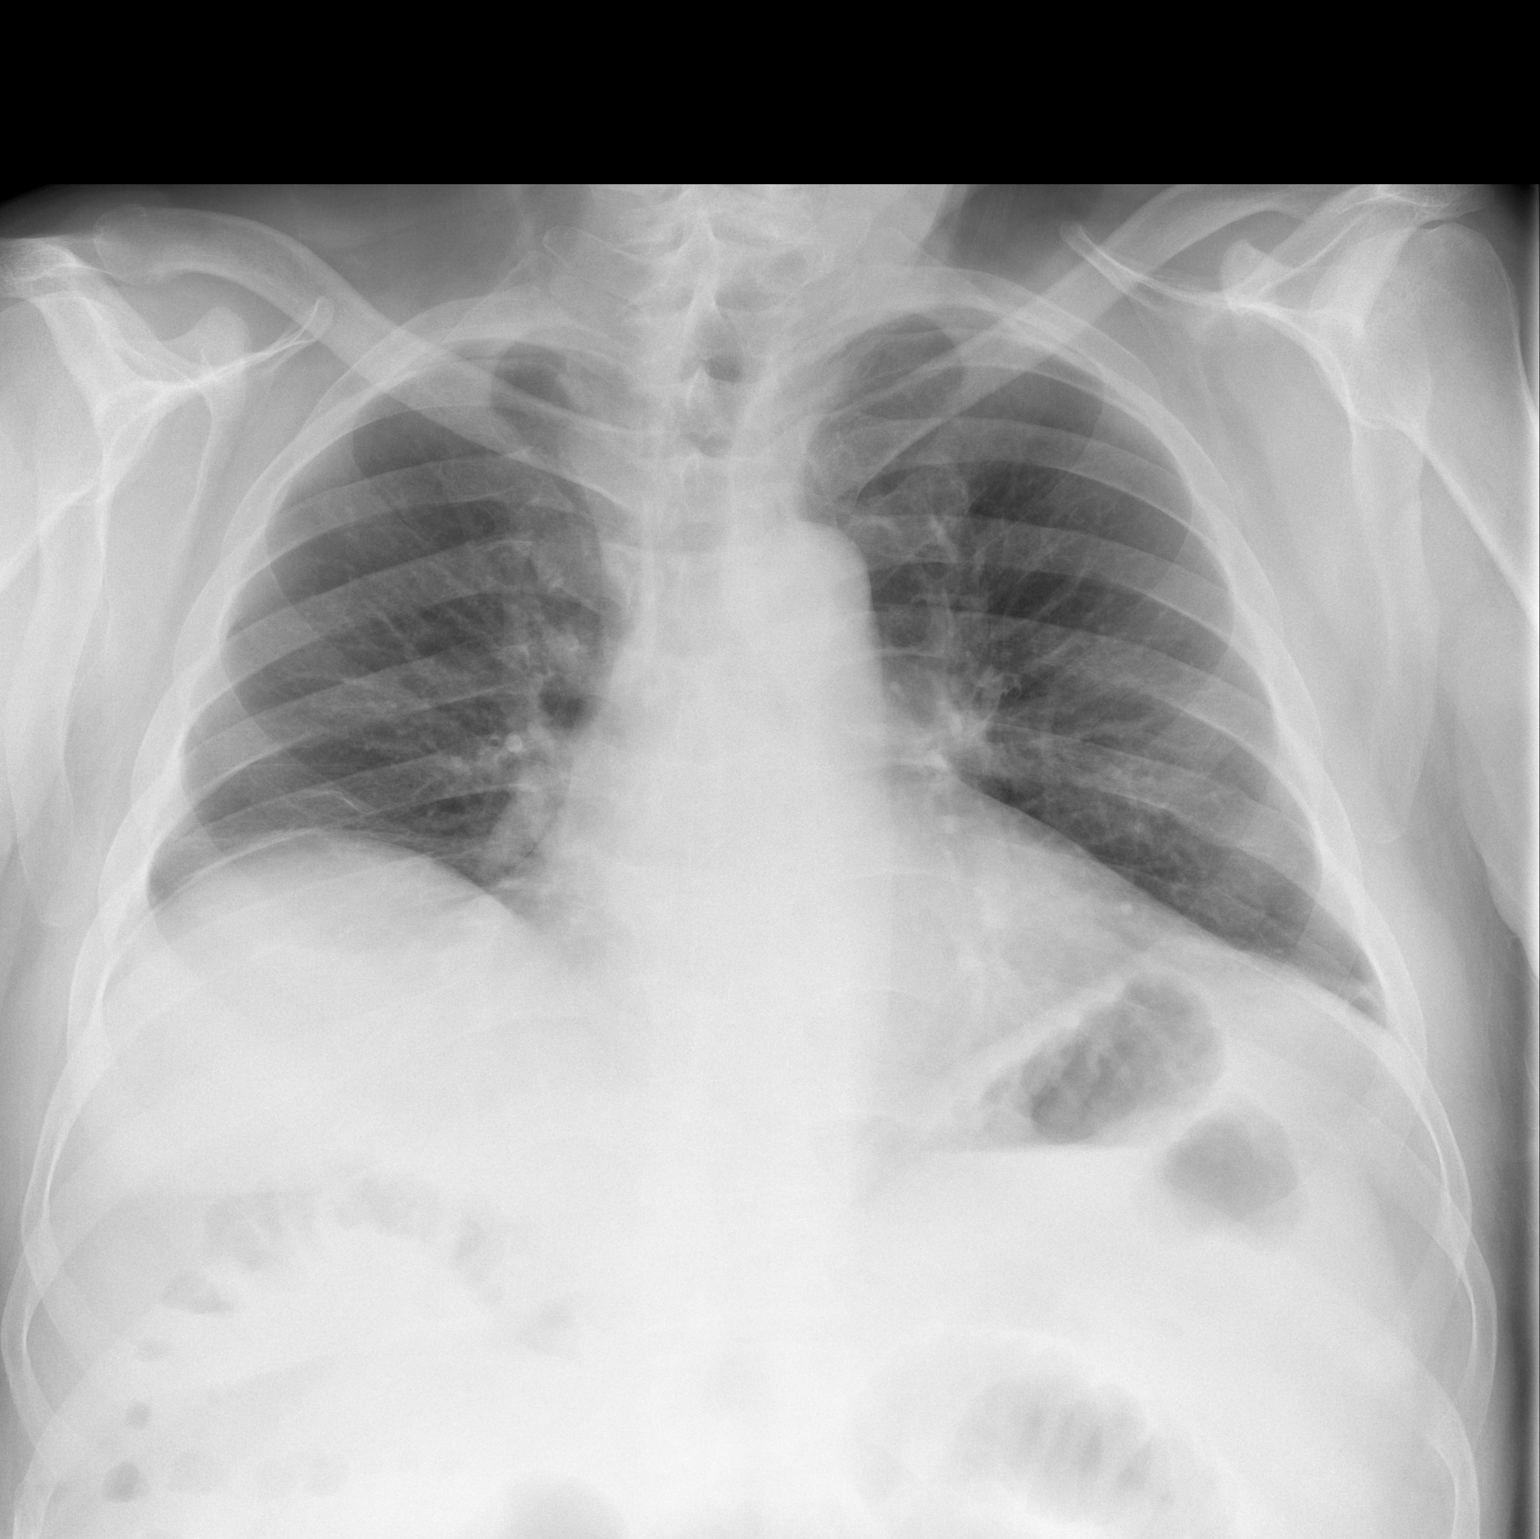

[w chest lat]
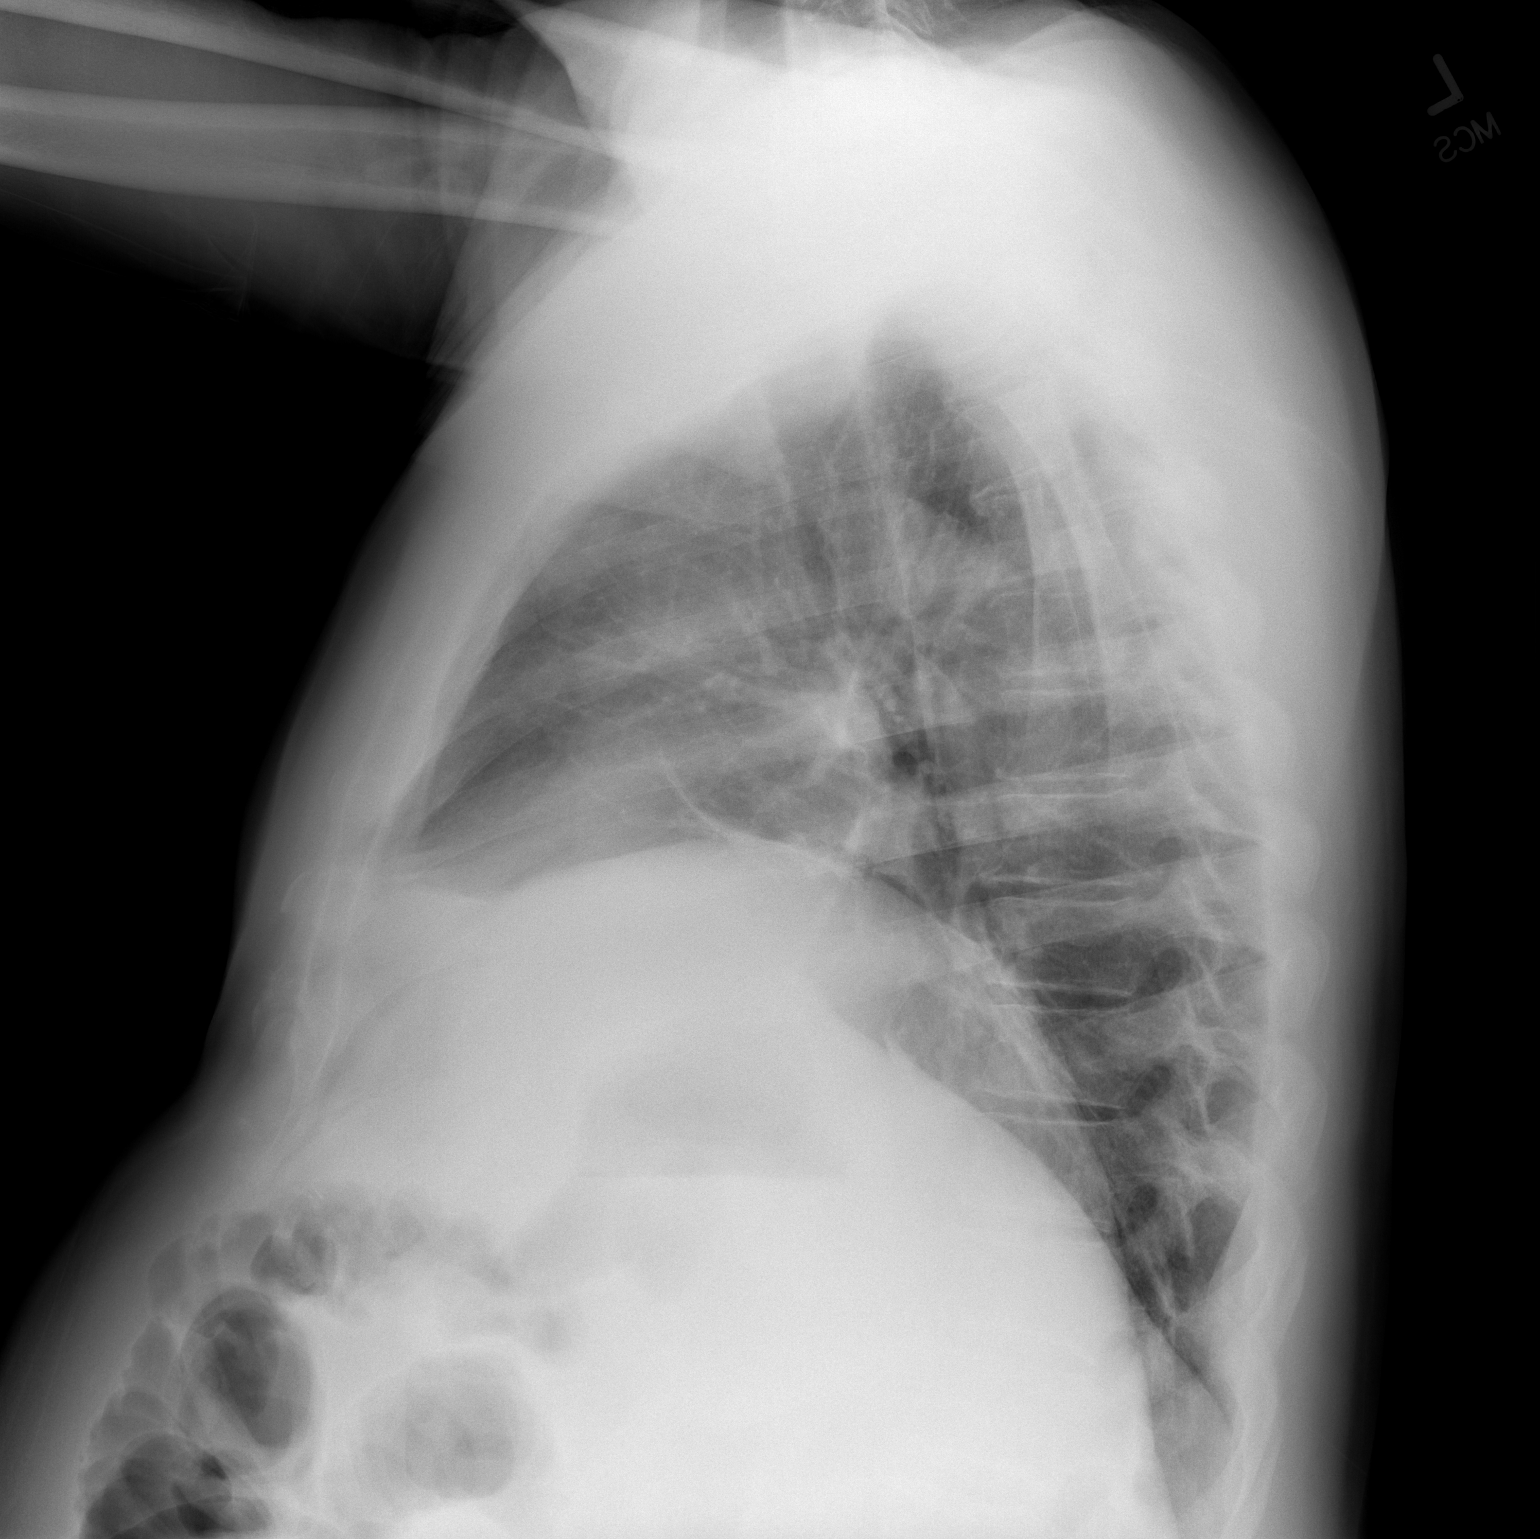

[2 of 2 positions shown; findings below may reference images not displayed]

FINDINGS: Transverse heart size at the upper limits of normal. Mediastinal
silhouette within normal limits.

Lungs hypoinflated. Linear bibasilar opacities most consistent with
subsegmental atelectasis. No focal infiltrates. No edema or
effusion. No pneumothorax.

No acute osseous finding. Remotely healed left-sided rib fractures
noted.
IMPRESSION: 1. Shallow lung inflation with mild bibasilar subsegmental
atelectasis.
2. No other active cardiopulmonary disease.

## 2022-10-04 ENCOUNTER — Encounter: Payer: Self-pay | Admitting: Radiology

## 2022-11-19 ENCOUNTER — Ambulatory Visit: Payer: Medicare HMO | Admitting: Orthopaedic Surgery

## 2022-11-19 ENCOUNTER — Encounter: Payer: Self-pay | Admitting: Orthopaedic Surgery

## 2022-11-19 ENCOUNTER — Other Ambulatory Visit (INDEPENDENT_AMBULATORY_CARE_PROVIDER_SITE_OTHER): Payer: Medicare HMO

## 2022-11-19 DIAGNOSIS — Z96652 Presence of left artificial knee joint: Secondary | ICD-10-CM

## 2022-11-19 NOTE — Progress Notes (Signed)
The patient is now just past 4 months status post a left total knee replacement.  He is 72 years old and very active.  He said he has some stiffness after bending a lot but no significant pain.  On exam his range of motion is getting close to entirely full.  He does have subjective numbness on the lateral aspect of his knee which is to be expected.  There is actually some patchy dry skin in this area that his dermatologist gave him some hydrocortisone cream to try and feel that would be useful as well.  This should continue to resolve with time.  X-rays of his left knee today show well-seated total knee arthroplasty with no complicating features.  His alignment is significantly improved.  From my standpoint we will see him back in 6 months unless there are issues.  He understands to expect warmth and swelling with his knee and this will hopefully subside as time goes by.  In 6 months we will have a final AP and lateral of his left knee.  If there are issues before then he knows to let us know.

## 2023-05-20 ENCOUNTER — Encounter: Payer: Self-pay | Admitting: Orthopaedic Surgery

## 2023-05-20 ENCOUNTER — Ambulatory Visit: Payer: Medicare HMO | Admitting: Orthopaedic Surgery

## 2023-05-20 ENCOUNTER — Other Ambulatory Visit (INDEPENDENT_AMBULATORY_CARE_PROVIDER_SITE_OTHER): Payer: Medicare HMO

## 2023-05-20 DIAGNOSIS — Z96652 Presence of left artificial knee joint: Secondary | ICD-10-CM

## 2023-05-20 NOTE — Progress Notes (Signed)
The patient is now past 10 months status post a left total knee replacement.  This was to treat severe left knee arthritis.  He denies any issues with his left knee at all.  He says he has good range of motion and strength.  He reports just a little bit of numbness to the lateral aspect of his incision.  His left knee has excellent range of motion and feels ligamentously stable.  There is no significant effusion.  An AP and lateral of the left knee show well-seated total knee arthroplasty with no complicating features.  At this point follow-up for his left knee can be as needed.  All question and concerns were answered and addressed.

## 2023-05-21 ENCOUNTER — Ambulatory Visit: Payer: Medicare HMO | Admitting: Orthopaedic Surgery
